# Patient Record
Sex: Male | Born: 1980 | Race: White | Hispanic: Yes | Marital: Single | Smoking: Never smoker
Health system: Southern US, Community
[De-identification: ages and names within clinical notes are randomized; demographics above are authoritative.]

## PROBLEM LIST (undated history)

## (undated) ENCOUNTER — Emergency Department (HOSPITAL_COMMUNITY): Discharge status: Absent without leave | Payer: Self-pay

## (undated) HISTORY — PX: LEG SURGERY: SHX1003

---

## 2002-11-24 ENCOUNTER — Emergency Department (HOSPITAL_COMMUNITY): Admission: EM | Admit: 2002-11-24 | Discharge: 2002-11-24 | Payer: Self-pay | Admitting: Emergency Medicine

## 2002-11-24 ENCOUNTER — Encounter: Payer: Self-pay | Admitting: Emergency Medicine

## 2002-12-28 ENCOUNTER — Encounter: Payer: Self-pay | Admitting: Emergency Medicine

## 2002-12-28 ENCOUNTER — Emergency Department (HOSPITAL_COMMUNITY): Admission: EM | Admit: 2002-12-28 | Discharge: 2002-12-28 | Payer: Self-pay | Admitting: Emergency Medicine

## 2003-02-21 ENCOUNTER — Emergency Department (HOSPITAL_COMMUNITY): Admission: EM | Admit: 2003-02-21 | Discharge: 2003-02-21 | Payer: Self-pay | Admitting: Emergency Medicine

## 2003-02-22 ENCOUNTER — Emergency Department (HOSPITAL_COMMUNITY): Admission: EM | Admit: 2003-02-22 | Discharge: 2003-02-22 | Payer: Self-pay | Admitting: Internal Medicine

## 2003-02-22 ENCOUNTER — Encounter: Payer: Self-pay | Admitting: Internal Medicine

## 2004-08-09 ENCOUNTER — Emergency Department (HOSPITAL_COMMUNITY): Admission: EM | Admit: 2004-08-09 | Discharge: 2004-08-09 | Payer: Self-pay | Admitting: Emergency Medicine

## 2004-08-22 ENCOUNTER — Emergency Department (HOSPITAL_COMMUNITY): Admission: EM | Admit: 2004-08-22 | Discharge: 2004-08-22 | Payer: Self-pay | Admitting: Emergency Medicine
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
## 2005-07-11 ENCOUNTER — Emergency Department (HOSPITAL_COMMUNITY): Admission: EM | Admit: 2005-07-11 | Discharge: 2005-07-11 | Payer: Self-pay | Admitting: Emergency Medicine

## 2006-10-22 ENCOUNTER — Emergency Department (HOSPITAL_COMMUNITY): Admission: EM | Admit: 2006-10-22 | Discharge: 2006-10-22 | Payer: Self-pay | Admitting: Emergency Medicine

## 2006-10-31 ENCOUNTER — Emergency Department (HOSPITAL_COMMUNITY): Admission: EM | Admit: 2006-10-31 | Discharge: 2006-10-31 | Payer: Self-pay | Admitting: Emergency Medicine

## 2006-11-05 ENCOUNTER — Emergency Department (HOSPITAL_COMMUNITY): Admission: EM | Admit: 2006-11-05 | Discharge: 2006-11-05 | Payer: Self-pay | Admitting: Emergency Medicine

## 2007-07-04 ENCOUNTER — Inpatient Hospital Stay (HOSPITAL_COMMUNITY): Admission: EM | Admit: 2007-07-04 | Discharge: 2007-07-05 | Payer: Self-pay | Admitting: Emergency Medicine

## 2007-07-13 ENCOUNTER — Encounter: Payer: Self-pay | Admitting: Orthopedic Surgery

## 2007-07-13 ENCOUNTER — Inpatient Hospital Stay (HOSPITAL_COMMUNITY): Admission: EM | Admit: 2007-07-13 | Discharge: 2007-07-18 | Payer: Self-pay | Admitting: Emergency Medicine

## 2007-07-14 ENCOUNTER — Ambulatory Visit: Payer: Self-pay | Admitting: Orthopedic Surgery

## 2007-07-18 ENCOUNTER — Encounter: Payer: Self-pay | Admitting: Orthopedic Surgery

## 2007-07-20 ENCOUNTER — Encounter: Payer: Self-pay | Admitting: Orthopedic Surgery

## 2007-07-24 ENCOUNTER — Ambulatory Visit: Payer: Self-pay | Admitting: Orthopedic Surgery

## 2007-07-24 ENCOUNTER — Telehealth: Payer: Self-pay | Admitting: Orthopedic Surgery

## 2007-07-24 DIAGNOSIS — S82209A Unspecified fracture of shaft of unspecified tibia, initial encounter for closed fracture: Secondary | ICD-10-CM | POA: Insufficient documentation

## 2007-07-24 DIAGNOSIS — S82409A Unspecified fracture of shaft of unspecified fibula, initial encounter for closed fracture: Secondary | ICD-10-CM

## 2007-07-31 ENCOUNTER — Telehealth: Payer: Self-pay | Admitting: Orthopedic Surgery

## 2007-08-02 ENCOUNTER — Encounter: Payer: Self-pay | Admitting: Orthopedic Surgery

## 2007-08-03 ENCOUNTER — Encounter: Payer: Self-pay | Admitting: Orthopedic Surgery

## 2007-08-03 ENCOUNTER — Emergency Department (HOSPITAL_COMMUNITY): Admission: EM | Admit: 2007-08-03 | Discharge: 2007-08-03 | Payer: Self-pay | Admitting: Emergency Medicine

## 2007-08-03 ENCOUNTER — Telehealth: Payer: Self-pay | Admitting: Orthopedic Surgery

## 2007-08-07 ENCOUNTER — Telehealth (INDEPENDENT_AMBULATORY_CARE_PROVIDER_SITE_OTHER): Payer: Self-pay | Admitting: *Deleted

## 2007-08-08 ENCOUNTER — Encounter (HOSPITAL_COMMUNITY): Admission: RE | Admit: 2007-08-08 | Discharge: 2007-09-07 | Payer: Self-pay | Admitting: Orthopaedic Surgery

## 2007-08-08 ENCOUNTER — Ambulatory Visit (HOSPITAL_COMMUNITY): Payer: Self-pay | Admitting: Orthopaedic Surgery

## 2007-08-13 ENCOUNTER — Encounter: Payer: Self-pay | Admitting: Orthopedic Surgery

## 2007-08-14 ENCOUNTER — Encounter: Payer: Self-pay | Admitting: Orthopedic Surgery

## 2007-08-14 ENCOUNTER — Ambulatory Visit (HOSPITAL_COMMUNITY): Admission: RE | Admit: 2007-08-14 | Discharge: 2007-08-14 | Payer: Self-pay | Admitting: Orthopaedic Surgery

## 2007-08-15 ENCOUNTER — Telehealth: Payer: Self-pay | Admitting: Orthopedic Surgery

## 2007-08-16 ENCOUNTER — Emergency Department (HOSPITAL_COMMUNITY): Admission: EM | Admit: 2007-08-16 | Discharge: 2007-08-16 | Payer: Self-pay | Admitting: Emergency Medicine

## 2007-08-16 ENCOUNTER — Encounter: Payer: Self-pay | Admitting: Orthopedic Surgery

## 2007-08-16 ENCOUNTER — Ambulatory Visit: Payer: Self-pay | Admitting: Orthopedic Surgery

## 2007-08-16 ENCOUNTER — Inpatient Hospital Stay (HOSPITAL_COMMUNITY): Admission: AD | Admit: 2007-08-16 | Discharge: 2007-08-22 | Payer: Self-pay | Admitting: Orthopedic Surgery

## 2007-08-17 ENCOUNTER — Telehealth: Payer: Self-pay | Admitting: Orthopedic Surgery

## 2007-08-17 ENCOUNTER — Encounter: Payer: Self-pay | Admitting: Orthopedic Surgery

## 2007-08-18 ENCOUNTER — Encounter: Payer: Self-pay | Admitting: Orthopedic Surgery

## 2007-08-19 ENCOUNTER — Encounter: Payer: Self-pay | Admitting: Orthopedic Surgery

## 2007-08-21 ENCOUNTER — Encounter: Payer: Self-pay | Admitting: Orthopedic Surgery

## 2007-08-21 ENCOUNTER — Telehealth: Payer: Self-pay | Admitting: Orthopedic Surgery

## 2007-08-22 ENCOUNTER — Telehealth: Payer: Self-pay | Admitting: Orthopedic Surgery

## 2007-08-23 ENCOUNTER — Encounter: Payer: Self-pay | Admitting: Orthopedic Surgery

## 2007-08-23 ENCOUNTER — Telehealth: Payer: Self-pay | Admitting: Orthopedic Surgery

## 2007-08-28 ENCOUNTER — Ambulatory Visit: Payer: Self-pay | Admitting: Orthopedic Surgery

## 2007-08-28 ENCOUNTER — Telehealth: Payer: Self-pay | Admitting: Orthopedic Surgery

## 2007-08-28 DIAGNOSIS — L02419 Cutaneous abscess of limb, unspecified: Secondary | ICD-10-CM | POA: Insufficient documentation

## 2007-08-28 DIAGNOSIS — L03119 Cellulitis of unspecified part of limb: Secondary | ICD-10-CM

## 2007-08-29 ENCOUNTER — Telehealth: Payer: Self-pay | Admitting: Orthopedic Surgery

## 2007-08-29 ENCOUNTER — Encounter: Payer: Self-pay | Admitting: Orthopedic Surgery

## 2007-08-31 ENCOUNTER — Emergency Department (HOSPITAL_COMMUNITY): Admission: EM | Admit: 2007-08-31 | Discharge: 2007-08-31 | Payer: Self-pay | Admitting: Emergency Medicine

## 2007-08-31 ENCOUNTER — Encounter: Payer: Self-pay | Admitting: Orthopedic Surgery

## 2007-09-01 ENCOUNTER — Emergency Department (HOSPITAL_COMMUNITY): Admission: EM | Admit: 2007-09-01 | Discharge: 2007-09-01 | Payer: Self-pay | Admitting: Emergency Medicine

## 2007-09-01 ENCOUNTER — Telehealth: Payer: Self-pay | Admitting: Orthopedic Surgery

## 2007-09-02 ENCOUNTER — Emergency Department (HOSPITAL_COMMUNITY): Admission: EM | Admit: 2007-09-02 | Discharge: 2007-09-02 | Payer: Self-pay | Admitting: Emergency Medicine

## 2007-09-03 ENCOUNTER — Encounter: Payer: Self-pay | Admitting: Orthopedic Surgery

## 2007-09-03 ENCOUNTER — Emergency Department (HOSPITAL_COMMUNITY): Admission: EM | Admit: 2007-09-03 | Discharge: 2007-09-03 | Payer: Self-pay | Admitting: Emergency Medicine

## 2007-09-04 ENCOUNTER — Telehealth: Payer: Self-pay | Admitting: Orthopedic Surgery

## 2007-09-05 ENCOUNTER — Encounter: Payer: Self-pay | Admitting: Orthopedic Surgery

## 2007-09-06 ENCOUNTER — Ambulatory Visit: Payer: Self-pay | Admitting: Orthopedic Surgery

## 2007-09-06 ENCOUNTER — Telehealth: Payer: Self-pay | Admitting: Orthopedic Surgery

## 2007-09-14 ENCOUNTER — Telehealth: Payer: Self-pay | Admitting: Orthopedic Surgery

## 2007-10-05 ENCOUNTER — Ambulatory Visit: Payer: Self-pay | Admitting: Orthopedic Surgery

## 2007-10-12 ENCOUNTER — Encounter: Payer: Self-pay | Admitting: Orthopedic Surgery

## 2007-10-13 ENCOUNTER — Encounter: Payer: Self-pay | Admitting: Orthopedic Surgery

## 2007-10-18 ENCOUNTER — Encounter: Payer: Self-pay | Admitting: Orthopedic Surgery

## 2007-10-31 ENCOUNTER — Emergency Department (HOSPITAL_COMMUNITY): Admission: EM | Admit: 2007-10-31 | Discharge: 2007-10-31 | Payer: Self-pay | Admitting: Emergency Medicine

## 2007-11-02 ENCOUNTER — Encounter: Payer: Self-pay | Admitting: Orthopedic Surgery

## 2007-11-08 ENCOUNTER — Ambulatory Visit: Payer: Self-pay | Admitting: Orthopedic Surgery

## 2008-01-02 ENCOUNTER — Telehealth: Payer: Self-pay | Admitting: Orthopedic Surgery

## 2008-01-31 ENCOUNTER — Ambulatory Visit: Payer: Self-pay | Admitting: Orthopedic Surgery

## 2008-02-01 ENCOUNTER — Encounter: Payer: Self-pay | Admitting: Orthopedic Surgery

## 2008-02-01 ENCOUNTER — Telehealth: Payer: Self-pay | Admitting: Orthopedic Surgery

## 2008-02-02 ENCOUNTER — Encounter: Payer: Self-pay | Admitting: Orthopedic Surgery

## 2008-02-06 ENCOUNTER — Telehealth: Payer: Self-pay | Admitting: Orthopedic Surgery

## 2008-02-07 ENCOUNTER — Telehealth: Payer: Self-pay | Admitting: Orthopedic Surgery

## 2008-02-09 ENCOUNTER — Encounter: Payer: Self-pay | Admitting: Orthopedic Surgery

## 2008-02-13 ENCOUNTER — Encounter: Payer: Self-pay | Admitting: Orthopedic Surgery

## 2008-02-13 ENCOUNTER — Telehealth: Payer: Self-pay | Admitting: Orthopedic Surgery

## 2008-02-14 ENCOUNTER — Encounter: Payer: Self-pay | Admitting: Orthopedic Surgery

## 2008-02-16 ENCOUNTER — Ambulatory Visit: Payer: Self-pay | Admitting: Orthopedic Surgery

## 2008-02-16 ENCOUNTER — Ambulatory Visit (HOSPITAL_COMMUNITY): Admission: RE | Admit: 2008-02-16 | Discharge: 2008-02-16 | Payer: Self-pay | Admitting: Orthopedic Surgery

## 2008-02-20 ENCOUNTER — Ambulatory Visit: Payer: Self-pay | Admitting: Orthopedic Surgery

## 2008-02-20 DIAGNOSIS — M7660 Achilles tendinitis, unspecified leg: Secondary | ICD-10-CM | POA: Insufficient documentation

## 2008-02-28 ENCOUNTER — Ambulatory Visit: Payer: Self-pay | Admitting: Orthopedic Surgery

## 2008-03-20 ENCOUNTER — Telehealth: Payer: Self-pay | Admitting: Orthopedic Surgery

## 2008-04-10 ENCOUNTER — Ambulatory Visit: Payer: Self-pay | Admitting: Orthopedic Surgery

## 2008-04-11 ENCOUNTER — Telehealth: Payer: Self-pay | Admitting: Orthopedic Surgery

## 2008-04-19 ENCOUNTER — Encounter: Payer: Self-pay | Admitting: Orthopedic Surgery

## 2008-05-22 ENCOUNTER — Ambulatory Visit: Payer: Self-pay | Admitting: Orthopedic Surgery

## 2008-05-24 ENCOUNTER — Encounter: Payer: Self-pay | Admitting: Orthopedic Surgery

## 2008-06-03 ENCOUNTER — Telehealth: Payer: Self-pay | Admitting: Orthopedic Surgery

## 2008-07-11 ENCOUNTER — Telehealth: Payer: Self-pay | Admitting: Orthopedic Surgery

## 2008-07-11 ENCOUNTER — Ambulatory Visit: Payer: Self-pay | Admitting: Orthopedic Surgery

## 2008-09-02 ENCOUNTER — Emergency Department: Payer: Self-pay | Admitting: Emergency Medicine

## 2008-10-09 ENCOUNTER — Telehealth: Payer: Self-pay | Admitting: Orthopedic Surgery

## 2008-10-09 ENCOUNTER — Encounter (INDEPENDENT_AMBULATORY_CARE_PROVIDER_SITE_OTHER): Payer: Self-pay | Admitting: *Deleted

## 2009-03-17 ENCOUNTER — Encounter: Payer: Self-pay | Admitting: Orthopedic Surgery

## 2009-05-25 ENCOUNTER — Emergency Department (HOSPITAL_COMMUNITY): Admission: EM | Admit: 2009-05-25 | Discharge: 2009-05-25 | Payer: Self-pay | Admitting: Emergency Medicine

## 2009-06-26 IMAGING — CR DG TIBIA/FIBULA 2V*L*
2 series · 2 of 2 positions shown · non-contrast
Comparison: 07/14/2007

CLINICAL DATA: Fall.  Recent surgery.

LEFT TIBIA AND FIBULA - 2 VIEW

[view not recorded (1 of 2)]
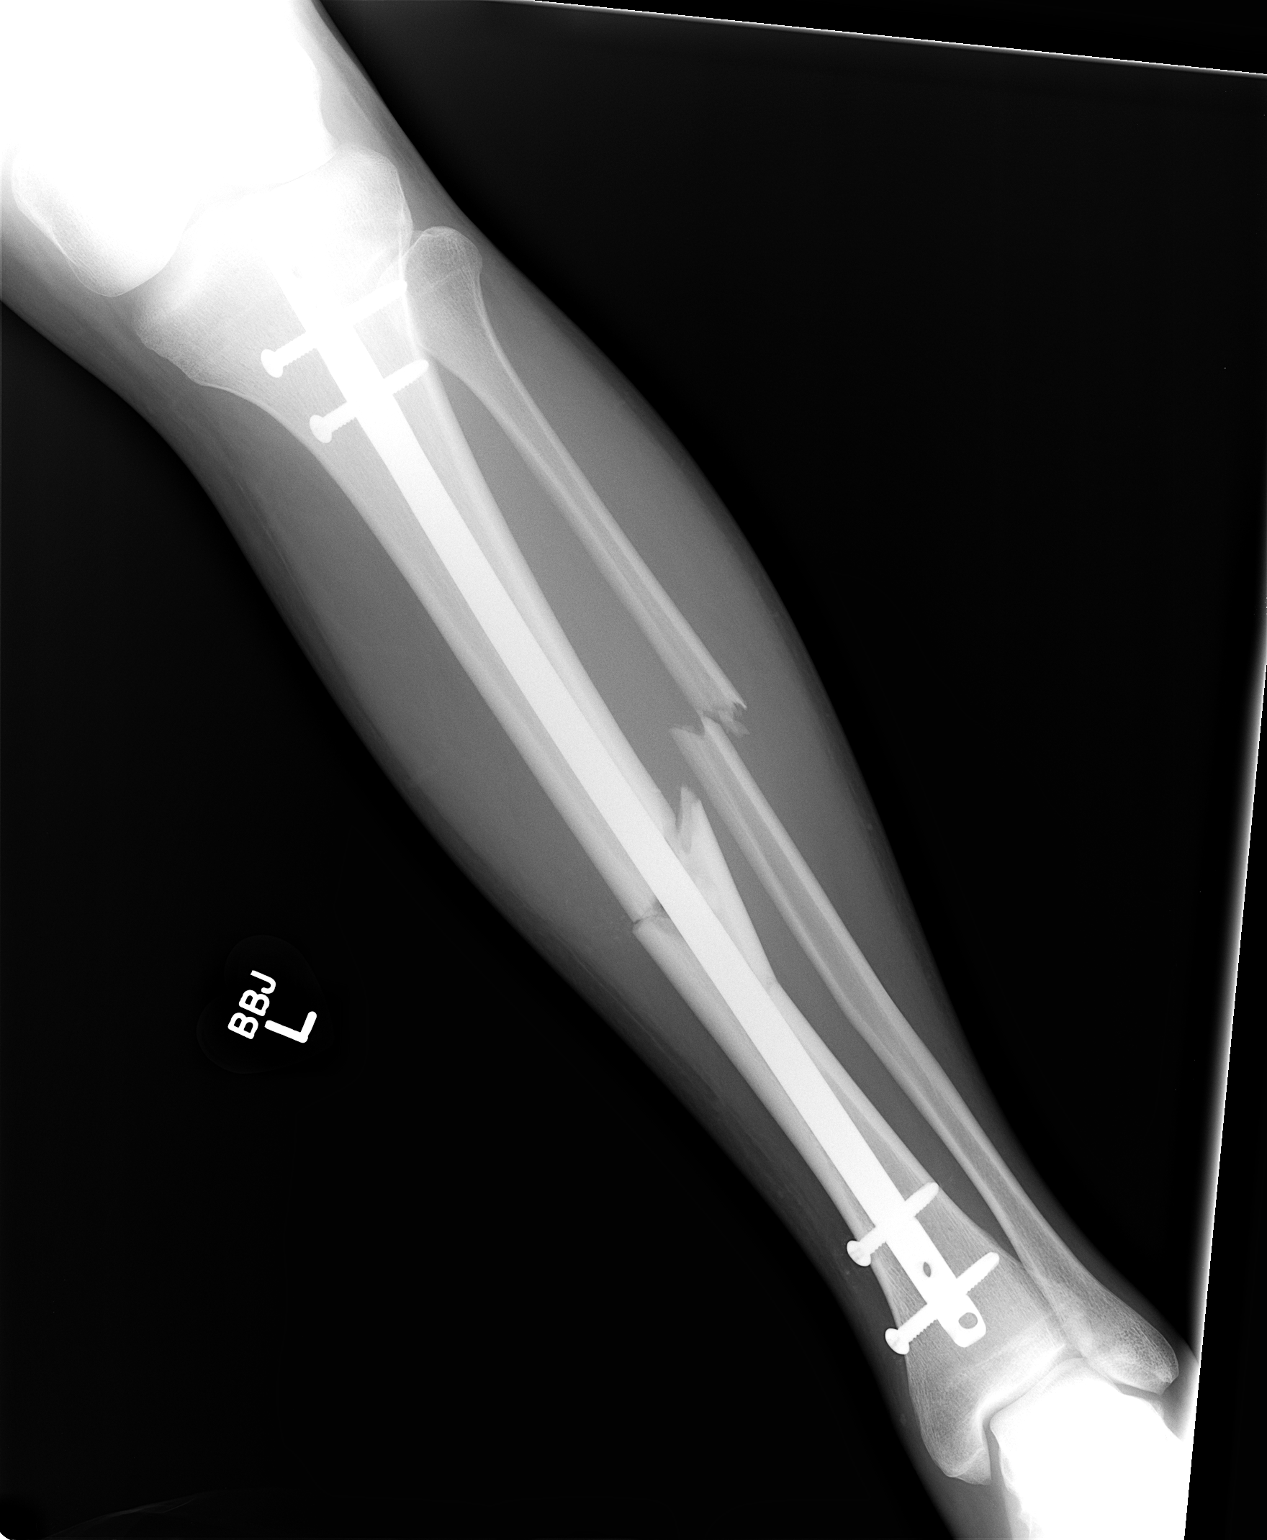

[view not recorded (2 of 2)]
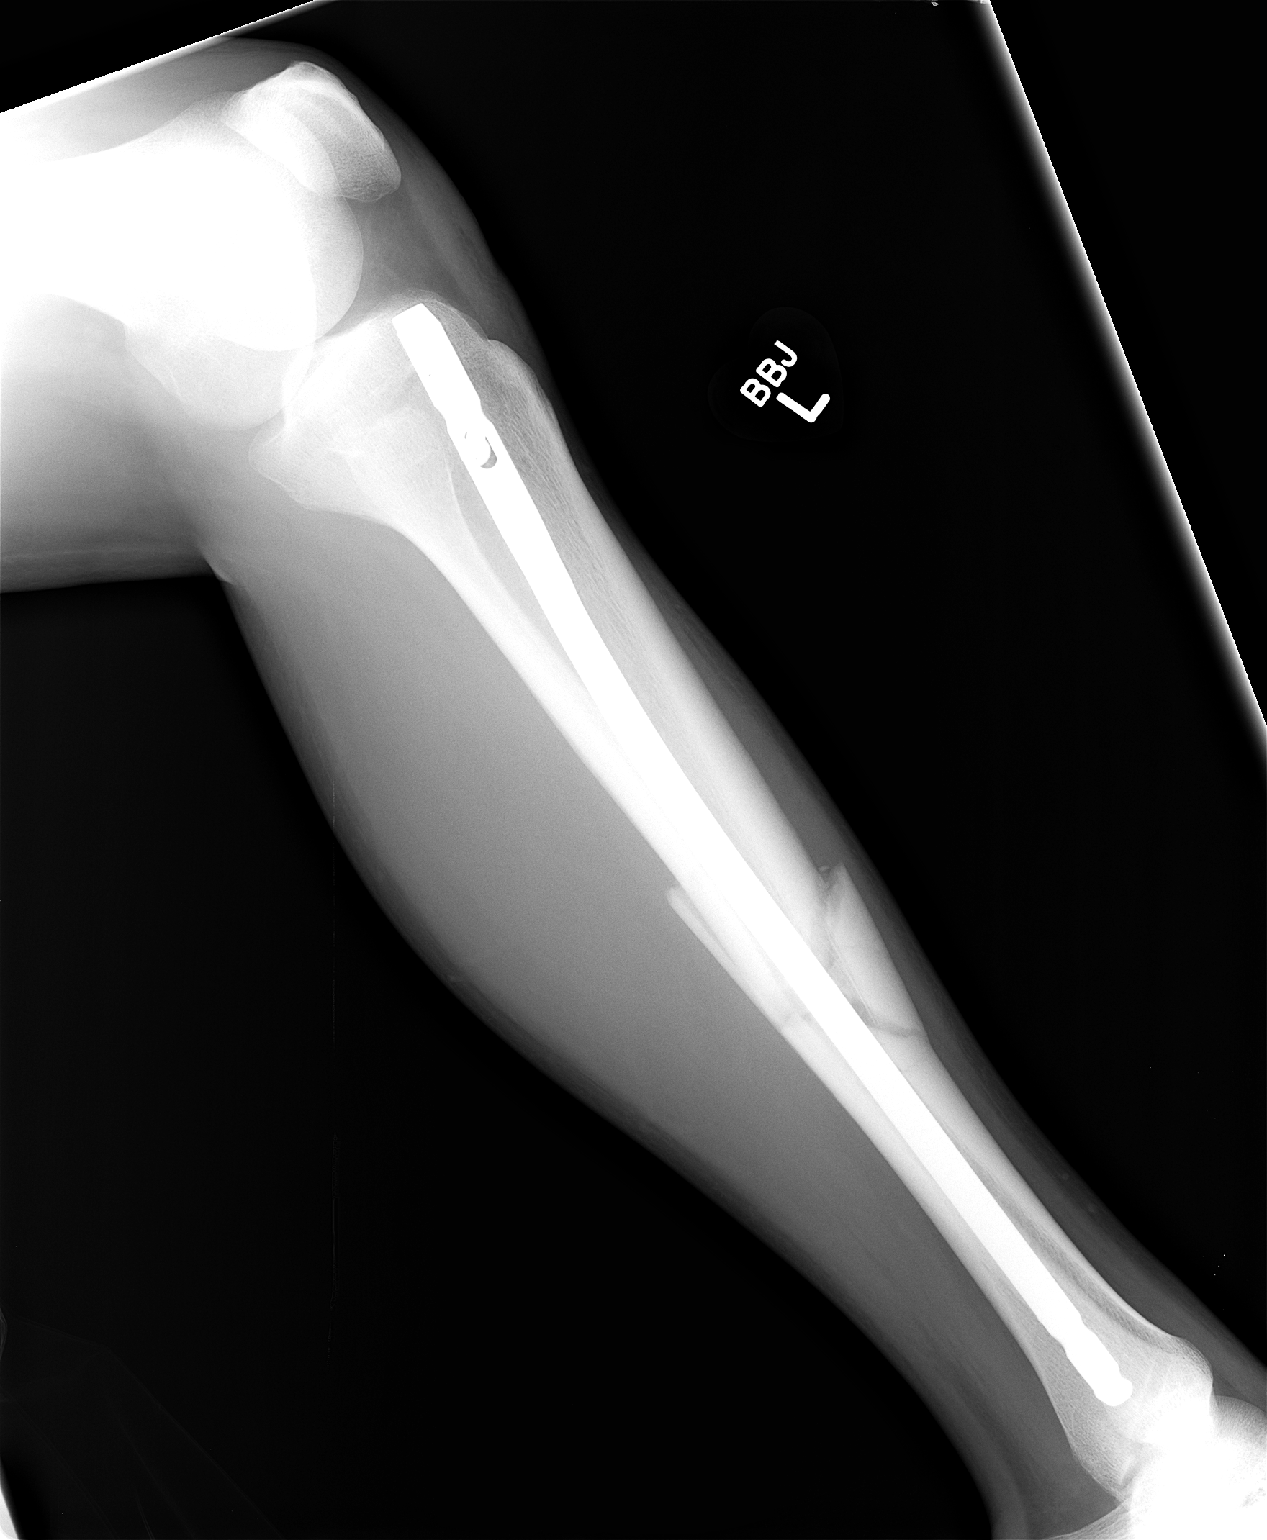

[2 of 2 positions shown; findings below may reference images not displayed]

FINDINGS: Antegrade intramedullary tibial rod with two proximal and
to distal interlocking screws noted.  Lateral butterfly fragment
appears in similar position to the postoperative fluoroscopic spot
films.  The displaced fibular fracture likewise appears stable
compared to the postoperative images.  No new fracture or
complicating feature is identified.
IMPRESSION: 1.  Stable appearance compared to the postoperative images.  No
complicating features identified.  No interval new fracture noted.

## 2009-07-07 IMAGING — XA IR FLUORO GUIDE CV LINE*R*
1 series · 2 of 2 positions shown · non-contrast
Comparison: none

CLINICAL DATA: Extremity infection, antibiotic use.

UPPER EXTREMITY PICC PLACEMENT WITH ULTRASOUND AND FLUORO GUIDANCE
TECHNIQUE: The right arm was prepped with chlorhexidine, draped in
the usual sterile fashion, and infiltrated locally with 1%
Lidocaine.  Ultrasound demonstrated patency of the right basilic
vein.  Under real-time ultrasound guidance, this vein was accessed
with a 21 gauge micropuncture needle.  Ultrasound image
documentation was performed.  The needle was exchanged over a
guidewire for a peel-away sheath through which a a 5-French double
lumen PICC trimmed to 42cm was advanced, positioned with its tip at
the distal SVC/right atrial junction.  Fluoroscopy during the
procedure and fluoro spot radiograph confirms appropriate catheter
position.  The catheter was flushed, secured to the skin with
Prolene sutures, and covered with a sterile dressing.  No immediate
complication.
Fluoroscopy Time: 0.5 minutes

[Series 1: run · 2 of 2 slices shown]
[im 1/2]
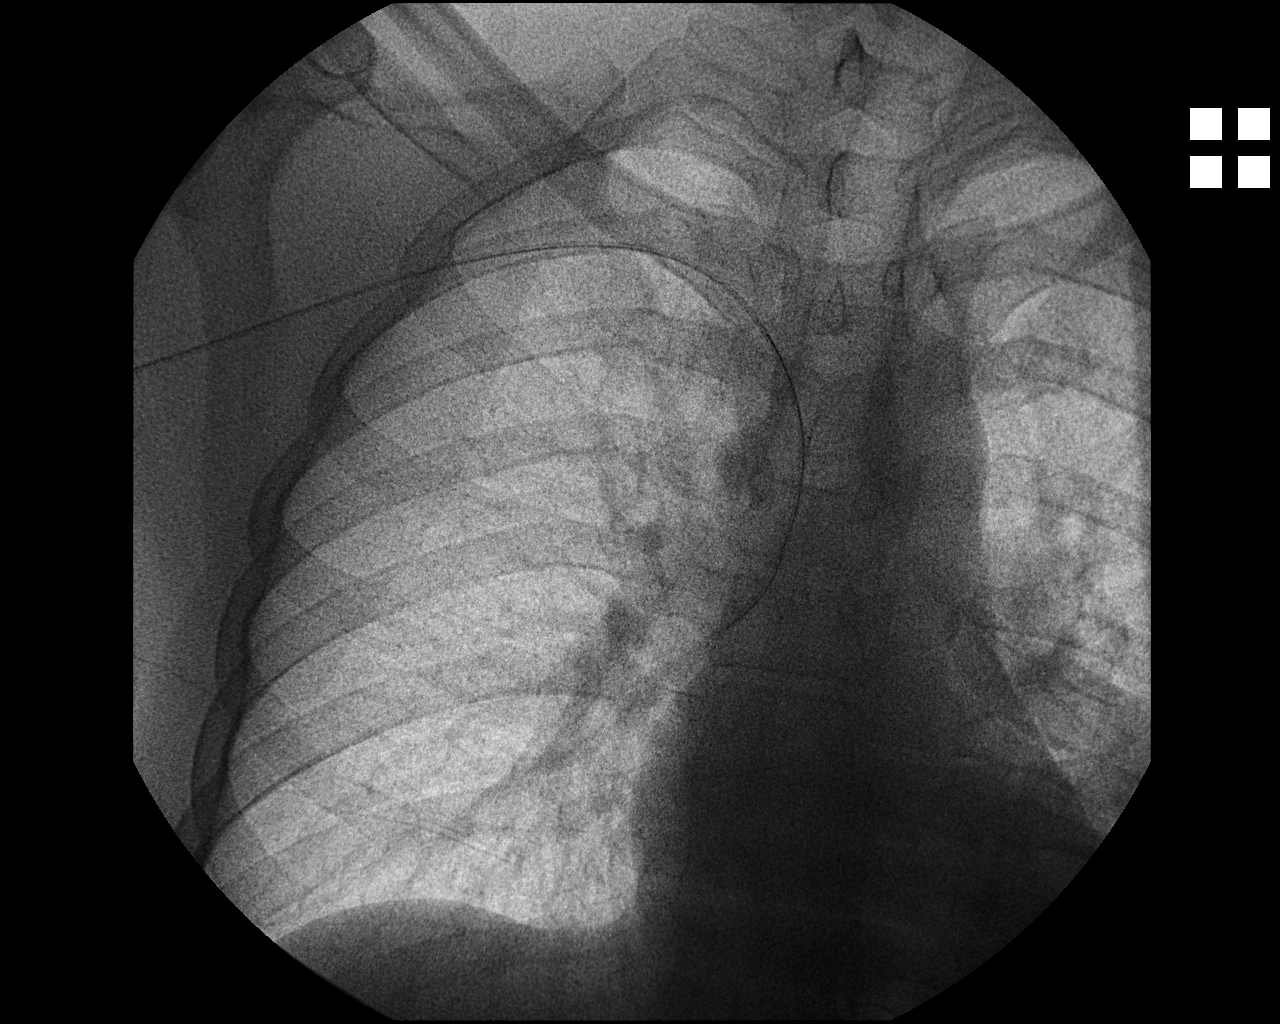
[im 2/2]
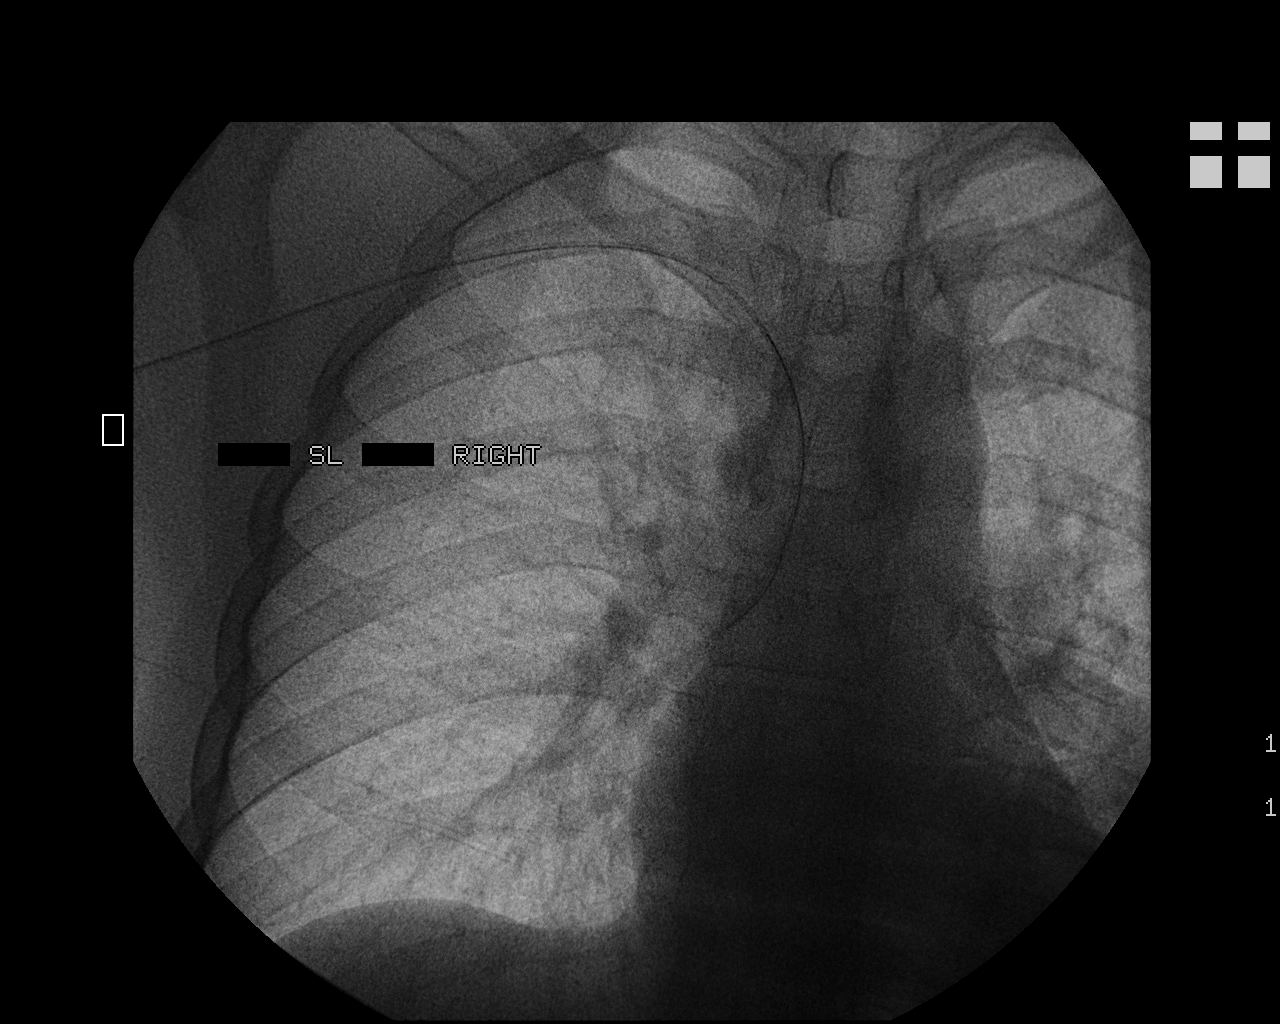

[2 of 2 positions shown; findings below may reference images not displayed]

IMPRESSION: Technically successful right arm PICC placement with ultrasound and
fluoroscopic guidance.  The catheter is ready for use.

## 2009-07-28 ENCOUNTER — Telehealth: Payer: Self-pay | Admitting: Orthopedic Surgery

## 2009-08-07 ENCOUNTER — Telehealth: Payer: Self-pay | Admitting: Orthopedic Surgery

## 2009-08-12 ENCOUNTER — Telehealth: Payer: Self-pay | Admitting: Orthopedic Surgery

## 2009-08-28 ENCOUNTER — Telehealth: Payer: Self-pay | Admitting: Orthopedic Surgery

## 2009-09-04 ENCOUNTER — Ambulatory Visit (HOSPITAL_COMMUNITY)
Admission: RE | Admit: 2009-09-04 | Discharge: 2009-09-04 | Payer: Self-pay | Admitting: Physical Medicine and Rehabilitation

## 2009-11-25 ENCOUNTER — Emergency Department (HOSPITAL_COMMUNITY): Admission: EM | Admit: 2009-11-25 | Discharge: 2009-11-25 | Payer: Self-pay | Admitting: Emergency Medicine

## 2010-01-20 ENCOUNTER — Emergency Department (HOSPITAL_COMMUNITY): Admission: EM | Admit: 2010-01-20 | Discharge: 2010-01-20 | Payer: Self-pay | Admitting: Obstetrics and Gynecology

## 2010-02-05 ENCOUNTER — Emergency Department (HOSPITAL_COMMUNITY): Admission: EM | Admit: 2010-02-05 | Discharge: 2010-02-05 | Payer: Self-pay | Admitting: Emergency Medicine

## 2010-06-02 NOTE — Progress Notes (Signed)
Summary: call from patient   ---- Converted from flag ---- ---- 08/07/2009 12:25 PM, Jacklynn Ganong wrote: Konrad Hoak needs you to call him at [phone].  Has a question about  if Dr. Romeo Apple is going to continue to see him and prescribe his meds.  Asking if he pays cash if he will see him. ------------------------------ I called patient back; left voice mail msg.  I have also left msg at [employer], [employer], to inquire about status of claim.  08/08/09 - Pamela from [employer] returned call and verified that patient's claim has settled, as of 08/27/08; claim is closed; therefore, patient's responsibility for any services provided.

## 2010-06-02 NOTE — Progress Notes (Signed)
Summary: Call back from patient  08/12/09 - Advised patient.  He will have our office do the referral to pain management.  ---- Converted from flag ---- ---- 08/11/2009 8:50 AM, Cammie Sickle wrote:   ---- 08/11/2009 8:42 AM, Fuller Canada MD wrote: Inform him that case is closed and he will need to see pain management specialist for pain meds   no appts needed   fracture has healed ------------------------------

## 2010-06-02 NOTE — Progress Notes (Signed)
Summary: vicodin refill  Phone Note Call from Patient Call back at [facility] Phone [phone]   Summary of Call: ok to refill vicodin 5 Initial call taken by: Chasity Tereasa Coop,  July 28, 2009 3:29 PM  Follow-up for Phone Call        refill Follow-up by: Fuller Canada MD,  July 28, 2009 3:30 PM  Additional Follow-up for Phone Call Additional follow up Details #1::        ok    Prescriptions: VICODIN 5-500 MG TABS (HYDROCODONE-ACETAMINOPHEN) one by mouth q 4 hrs as needed pain  #90 x 2   Entered by:   Ether Griffins   Authorized by:   Fuller Canada MD   Signed by:   Ether Griffins on 07/28/2009   Method used:   Historical   RxID:   [id number]

## 2010-06-02 NOTE — Progress Notes (Signed)
Summary: Patient declined appointment with Dr. Eduard Clos.  Phone Note From Other Clinic   Caller: Referral Coordinator Summary of Call: They called to let us know that they talked to the patient and declined the appointment because of his work schedule Initial call taken by: Waldon Reining,  August 28, 2009 3:19 PM

## 2010-06-06 ENCOUNTER — Emergency Department (HOSPITAL_COMMUNITY)
Admission: EM | Admit: 2010-06-06 | Discharge: 2010-06-06 | Disposition: A | Discharge status: Home / Self Care | Payer: Self-pay | Attending: Emergency Medicine | Admitting: Emergency Medicine

## 2010-06-06 ENCOUNTER — Emergency Department (HOSPITAL_COMMUNITY): Payer: Self-pay

## 2010-06-06 DIAGNOSIS — IMO0002 Reserved for concepts with insufficient information to code with codable children: Secondary | ICD-10-CM | POA: Insufficient documentation

## 2010-06-06 DIAGNOSIS — M79609 Pain in unspecified limb: Secondary | ICD-10-CM | POA: Insufficient documentation

## 2010-06-06 DIAGNOSIS — F411 Generalized anxiety disorder: Secondary | ICD-10-CM | POA: Insufficient documentation

## 2010-06-06 DIAGNOSIS — Z9889 Other specified postprocedural states: Secondary | ICD-10-CM | POA: Insufficient documentation

## 2010-06-06 DIAGNOSIS — G8929 Other chronic pain: Secondary | ICD-10-CM | POA: Insufficient documentation

## 2010-06-24 ENCOUNTER — Encounter: Payer: Self-pay | Admitting: Orthopedic Surgery

## 2010-06-30 NOTE — Letter (Signed)
Summary: Phone Note  Phone Note   Imported By: Cammie Sickle 06/26/2010 11:25:57  _____________________________________________________________________  External Attachment:    Type:   Image     Comment:   External Document

## 2010-07-16 LAB — CULTURE, ROUTINE-ABSCESS

## 2010-09-15 NOTE — H&P (Signed)
NAME:  DAMICO, PARTIN              ACCOUNT NO.:  [id number]   MEDICAL RECORD NO.:  [id number]          PATIENT TYPE:  INP   LOCATION:  ED99                          FACILITY:  [facility]   PHYSICIAN:  Osvaldo Shipper, MD     DATE OF BIRTH:  [birth year]   DATE OF ADMISSION:  07/04/2007  DATE OF DISCHARGE:  LH                              HISTORY & PHYSICAL   PRIMARY MEDICAL DOCTOR:  Unassigned   ADMISSION DIAGNOSES:  1. Unintentional drug overdose.  2. History of depression.   CHIEF COMPLAINTS:  Decreased responsiveness along with nausea, vomiting,  diarrhea.   HISTORY OF PRESENT ILLNESS:  The patient is a 30 year old Hispanic male  who has a remote history of depression and has one episode of suicide  attempt in the past, but he has never been hospitalized in a psychiatric  facility as per the patient who was drinking with his friends last night  and he took 20 pills of Lortab 7.5/5, four pills of Percocet 10, one  pill of OxyContin and 3 pills of Xanax, unknown dose.  The patient  started becoming lethargic and his friend brought him to the ED.  He  mentioned that this drug use was purely recreational.  He denied any  suicidal attempt or intent.   Currently the patient is complaining of nausea.  He is had one episode  of emesis and he has had a few episodes of diarrhea while he was in the  ED.   MEDICATIONS AT HOME:  None.   ALLERGIES:  NO KNOWN DRUG ALLERGIES.   PAST MEDICAL HISTORY:  Except for the suicidal attempt, unremarkable as  per the patient.  He has never had any surgeries in the past.   SOCIAL HISTORY:  Lives in [place] with his mother.  He is separated.  He has two children.  He currently is unemployed.  Occasionally, smokes  and denies any illicit drug use.  Does not drink on a daily basis.   FAMILY HISTORY:  Positive for heart disease, emphysema and lung cancer.   REVIEW OF SYSTEMS:  A 10-point system was negative except as mentioned  in the HPI.   PHYSICAL  EXAMINATION:  VITAL SIGNS: Temperature is 98.6, blood pressure  131/61, heart rate 72, respiratory rate 18, saturation 100% on room air.  GENERAL:  A well-developed, well-nourished young male in no distress.  HEENT:  There is no pallor, no icterus.  Oral mucous membranes moist.  No oral lesions are noted.  NECK:  Soft and supple.  No thyromegaly is appreciated.  LUNGS:  Clear to auscultation bilaterally.  No wheezing, rales or  rhonchi appreciated.  CARDIOVASCULAR:  S1, S2 is normal, regular.  No murmurs appreciated.  ABDOMEN:  Soft, there is a vague mild tenderness in the abdomen.  No  rebound rigidity or guarding.  Bowel sounds are present.  No mass or  organomegaly is appreciated.  EXTREMITIES:  Show no edema.  Peripheral pulses are palpable.  SKIN:  Multiple tattoo formation.   LABORATORY DATA:  His CBC shows a white count 10.6, hemoglobin 15,  platelet counts 288. His  C-met is unremarkable.  Amylase/lipase is  normal.  Tylenol level is less than 10 x2, salicylate level less than  four.  Urine drug screen positive for benzodiazepines and opiates.  Alcohol level 273.   No imaging studies have been done so far.   ASSESSMENT:  This is a 30 year old male who presents with drug overdose.  He was lethargic when he came in and now he is awake and alert and  oriented.  This drug overdose was recreational.  There was no intent to  take his own life.  The patient is quite emphatic about this.  He is  having nausea, vomiting and diarrhea which could be a sequelae of all  the drugs that he took.   PLAN:  1. Likely unintentional, recreational drug use.  We will observe him      in the hospital and see how he does, then we will have ACT team      evaluate him tomorrow morning when he will be more medically      stable.  I anticipate the patient going home if he improves.  2. Nausea, vomiting, diarrhea likely has a result of all of his      medications that he took.  We will treat this  symptomatically.      Will obtain acute abdominal series.  We will put on a PPI.  If his      symptoms do not improve, other imaging studies such as a CAT scan      may have to be considered.  3. DVT prophylaxis will be initiated.  He will be put on p.r.n. Ativan      for agitation, anxiety.  His alcohol use, though he says he takes      only occasionally, cannot be verified.  4. Anticipate patient going home in the next day or two.      Osvaldo Shipper, MD  Electronically Signed     GK/MEDQ  D:  07/04/2007  T:  07/04/2007  Job:  [id number]
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
## 2010-09-15 NOTE — Group Therapy Note (Signed)
NAME:  [name], [name] NO.:  [id number]   MEDICAL RECORD NO.:  [id number]          PATIENT TYPE:  INP   LOCATION:  A329                          FACILITY:  [facility]   PHYSICIAN:  Vickki Hearing, M.D.DATE OF BIRTH:  12/29/1980   DATE OF PROCEDURE:  08/18/2007  DATE OF DISCHARGE:                                 PROGRESS NOTE   Jeffrey Huber was interested in transferring his care to [facility];  however, his nurse from his [employer] could not get  that arranged.  Therefore, the patient's surgery was postponed.  In  addition to that, he ate throughout Thursday night and was not  surgically sound to have surgery due to his frequent intake of food  throughout the early morning of Friday morning and not being NPO after  midnight.   There was much confusion regarding his disposition.  He and his nurse  had discussed him being transferred to [facility].  She was trying to  arrange a physician there to take care of him.  This was not  accomplished.  Some of this was done outside of my knowledge, and  therefore, the disposition was unclear.  However, Jeffrey Huber did agree to  have surgery here on Saturday morning.   He remains afebrile and his pain level is controlled with a PCA pump and  oral analgesics.      Vickki Hearing, M.D.  Electronically Signed     SEH/MEDQ  D:  08/19/2007  T:  08/19/2007  Job:  [id number]

## 2010-09-15 NOTE — Discharge Summary (Signed)
NAME:  Jeffrey Huber, Jeffrey Huber NO.:  [id number]   MEDICAL RECORD NO.:  [id number]          PATIENT TYPE:  INP   LOCATION:  A306                          FACILITY:  [facility]   PHYSICIAN:  Dorris Singh, DO    DATE OF BIRTH:  [birth year]   DATE OF ADMISSION:  07/04/2007  DATE OF DISCHARGE:  03/04/2009LH                               DISCHARGE SUMMARY   PRIMARY CARE PHYSICIAN:  He is unassigned.   ADMISSION DIAGNOSIS:  Unintentional drug overdose and a history of  depression.   DISCHARGE DIAGNOSIS:  Unintentional drug overdose and a history of  depression.   HISTORY/PHYSICAL:  His H&P was done by Dr. Rito Ehrlich, please refer to  that.   HOSPITAL COURSE:  The patient has markedly improved due to his  unintentional recreational use of Percocets and hydrocodone he states  that he was taking and did not realize how many he had taken.  Luckily,  he was with friends who were able to recognize that his pulse rate and  his breathing had changed dramatically.  He was seen by ACT team today,  who Samson Frederic with the ACT team made him sign a contract for safety and he  will then be sent home.  I will go ahead and get him set up with primary  care physician on call.  His nausea and vomiting has decreased.  He is  able to eat without any problems.  We will go ahead and send him home  today.  His condition is stable.   DISPOSITION:  To home with follow up for PCP.  I discussed at length  with the patient his need to really monitor the type of medications that  he puts in his body at any time, even for recreation. The patient stated  understanding.      Dorris Singh, DO  Electronically Signed     CB/MEDQ  D:  07/05/2007  T:  07/05/2007  Job:  [id number]
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
## 2010-09-15 NOTE — Discharge Summary (Signed)
NAME:  Jeffrey Huber, Jeffrey Huber NO.:  [id number]   MEDICAL RECORD NO.:  [id number]          PATIENT TYPE:  INP   LOCATION:  A329                          FACILITY:  [facility]   PHYSICIAN:  Vickki Hearing, M.D.DATE OF BIRTH:  12/12/1980   DATE OF ADMISSION:  08/16/2007  DATE OF DISCHARGE:  LH                               DISCHARGE SUMMARY   ADMITTING DIAGNOSES:  1. Infection left tibia.  2. Left tibia-fibula fracture.   DISCHARGE DIAGNOSES:  1. Infection left tibia.  2. Left tibia-fibula fracture.   HISTORY:  This is a 30 year old male who was injured while logging.  A  tree snapped and hit his left tibia.  He sustained a closed left tib-fib  fracture with moderate sized butterfly fragment on July 13, 2007.  He  had surgery on July 14, 2007, with closed intramedullary nailing with a  Synthes locked nail.  We used a 10 x 345 mm nail.  The patient initially  did well postoperatively, essentially had no major problems and was  discharged home.   Postoperatively, radiographs were taken.  He was doing fine.  However,  some time around August 03, 2007, he presented to the emergency room  complaining of pain after falling.  No deformity was noted.  Radiographs  were noted as normal.   He presented back to the emergency room again on August 16, 2007, at  [facility] asking for pain medication.   During the week of August 07, 2007 through August 11, 2007, the patient  presented to Dr. Hilda Lias, who was covering for me in my absence with a  soft tissue infection over the mid shaft of his tibia.  IV vancomycin  was started.  Cultures were obtained from a draining wound which was  pinpoint and was found to have methicillin-resistant staph, sensitive to  vancomycin.   At that time on August 10, 2007, his sed rate was 1, his white count was  70.  He had no left shift and his wound seemed to clearing on  vancomycin.   I saw the patient on August 16, 2007.  He had increased  pain and drainage  and I put him in the hospital.  I kept him on vancomycin.  His sed rate  was 5.  His C-reactive protein was 0.4.  His white count was still  normal.  He was afebrile.  At that time, he had a pinpoint area of  drainage from the posteromedial aspect of the soft tissue at the mid  shaft level of the tibia.   The patient was scheduled for surgery for Friday, August 18, 2007,  however, I was alerted that the patient wanted to be transferred to  [facility].  I spoke to Rico Sheehan, [phone], who is the  case manager for [name] as this is a Workers Compensation case, and  she indicated that Dr. Hyman Hopes had accepted transfer via his nurse Almira Coaster,  and the patient was to be transferred to whoever was on-call for Dr.  Hyman Hopes, and I attempted to make these arrangements.  The patient is  afebrile with  stable vital signs.  He is on a Dilaudid PCA pump and  Percocet 5 mg q.4 p.r.n. for pain.   DISCHARGE DISPOSITION:  Transfer to [facility].   OVERALL CONDITION:  Stable.      Vickki Hearing, M.D.  Electronically Signed     SEH/MEDQ  D:  08/17/2007  T:  08/17/2007  Job:  [id number]

## 2010-09-15 NOTE — Op Note (Signed)
NAME:  REESE, STOCKMAN NO.:  [id number]   MEDICAL RECORD NO.:  [id number]          PATIENT TYPE:  INP   LOCATION:  A329                          FACILITY:  [facility]   PHYSICIAN:  Vickki Hearing, M.D.DATE OF BIRTH:  07-Mar-1981   DATE OF PROCEDURE:  DATE OF DISCHARGE:                               OPERATIVE REPORT   HISTORY:  This is a 30 year old male, was injured on July 13, 2007,  logging.  He had a closed intramedullary nailing of his left tibia with  a Synthes locked nail on July 14, 2007, for closed fracture.  He did  well until the week of August 03, 2007, through August 07, 2007.  Apparently, he fell and went to the emergency room.  Radiographs were  taken, there was no change in position of the fracture and he eventually  saw Dr. Hilda Lias while I was on vacation, complaining of swelling and  pain in his left leg.  At that time, a diagnosis of cellulitis was made.  The patient was given some Rocephin, came back a day or so later and had  drainage.  This was cultured and grew out methicillin-resistant staph.  The patient was started on IV vancomycin and continued that until I saw  him on August 16, 2007, when he had increased pain and drainage and he  was admitted to the hospital with a sed rate of 5 and C-reactive protein  of 0.4, white count normal.  He was afebrile.  He had an area of  pinpoint drainage from the posteromedial aspect of the soft tissue at  the level of the mid shaft of the tibia.  He was scheduled for surgery  Friday, August 18, 2007.  However, there was confusion about his  disposition and therefore he was kept here and surgery was done today.   PREOPERATIVE DIAGNOSIS:  Abscess, left leg.   POSTOPERATIVE DIAGNOSIS:  Abscess, left leg.   PROCEDURE:  Incision and drainage of left lower leg abscess.   SURGEON:  Vickki Hearing, M.D.   ASSISTANT:  There were no assistants.   ANESTHETIC:  General.   SPECIMENS:  There were specimens for  culture, which were taken as swabs.   OPERATIVE FINDINGS:  There was a pinpoint area of drainage from  approximately the mid shaft level of the tissue just off the  posteromedial edge of the tibia.  This drainage was expressible from the  anteromedial surface of the leg, and upon opening this area, the  purulence tracked to the crest of the tibia above the level of the  periosteum.  There was no purulent material expressible from the  posterior soft tissues in the posteromedial compartments.  The bone was  not exposed.  The wound was irrigated.  The sinus tract was excised,  prior to excision of the sinus tract, it was probed and did not track  any deeper.   PROCEDURE IN DETAILS:  Mr. Novello leg was marked as the surgical site,  countersigned, was taken to surgery.  No antibiotics were given.  His  cultures were to be taken.  He was  put to sleep with general anesthetic  smoothly.   We prepped his leg with Betadine solution and scrub.  We draped him  sterilely, completed our time-out, elevate the tourniquet to 275 where  it stayed for 15 minutes.   We made the incision inferior to the most posteromedial aspect of the  tibia to ensure soft tissue coverage in case the infection tracked into  the bone.  We then undermined the subcutaneous layer, which had an  obvious plane from where the purulent drainage was coming from. We tried  to express this of any extra purulence posteromedially and  anterolaterally as we could over the bone and then we irrigated the  area, and packed it with a saline dressing.  We did explore the sinus  tract and did not track any deeper in the posteromedial aspect of the  leg.   The tourniquet was released after the sterile dressing was applied.   The postoperative plan is to resume the vancomycin, perform dressing  change in 48 hours, assess the wound, assess if need for further wound  debridements.  Of course,  check the cultures, repeat sed rates, and  C-  reactive proteins over the next week.      Vickki Hearing, M.D.  Electronically Signed     SEH/MEDQ  D:  08/19/2007  T:  08/19/2007  Job:  [id number]

## 2010-09-15 NOTE — H&P (Signed)
NAME:  Jeffrey, Huber NO.:  [id number]   MEDICAL RECORD NO.:  [id number]          PATIENT TYPE:  INP   LOCATION:  A329                          FACILITY:  [facility]   PHYSICIAN:  Vickki Hearing, M.D.DATE OF BIRTH:  1981/03/19   DATE OF ADMISSION:  DATE OF DISCHARGE:  LH                              HISTORY & PHYSICAL   CHIEF COMPLAINT:  Pain, left tibia with drainage.   HISTORY:  Jeffrey Huber is a 30 year old male, who was injured while logging.  A tree snapped, hit his left tibia.  He sustained a left tib/fib closed  fracture with a moderate size butterfly fragment.  He was brought into  the hospital on March 12, the date of injury, and had surgery on March  13 with a closed intramedullary nailing with a Synthes locked nail.  The  patient did well postoperatively, had essentially no major problems, was  discharged home.  He was seen postoperatively, radiographs were  obtained.  He was doing fine; however, on April 2, he presented to the  emergency room complaining of pain after he fell re-injuring his left  leg, although there was no deformity and normal radiographs were noted.  He was sent home on Lortab 10 mg and then presented back to the ER again  on April 15 at [facility] asking for more pain medication.  Dr.  Hilda Lias had seen him in my absence and started him on IV vancomycin due  to what was thought to be a soft tissue infection over the mid tibia.   His lab results on April 9 showed a sed rate of 1 and a white count of 7  with no left shift.  His new labs show a sed rate of 5 and a C-reactive  protein of 0.4, normal being greater than 0.6.  He is still afebrile.   Of note, the patient has had admission for drug overdose on July 05, 2007, was treated in the hospital.   At this point, it appears that the infection is superficial and not in  the intramedullary canal.  We will do an incision and drainage of the  area of maximal tenderness and  drainage and then pack the wound and most  likely do a secondary closure.   He does not have any allergies.  He has never had any other surgery.  He  lives in [place].  He is separated, has two children.   FAMILY HISTORY:  Notable for heart disease, emphysema, and lung cancer.   REVIEW OF SYSTEMS:  Other than his psychiatric and drug abuse history is  negative.   PHYSICAL EXAMINATION:  His vital signs remain normal with a temperature  of 97, a pulse of 82, a respiratory rate of 20, a blood pressure of  132/72.  His appearance is normal.  He has a muscular build.  He has  good pulse perfusion and flow with no peripheral edema in his  cardiovascular system.   Skin is notable for pinpoint area of drainage on the posteromedial  aspect of the left leg.  There is surrounding erythema.  This area is  approximately 5 cm x 4 cm in length and width.  It does not progress  proximally or distally.  There is no drainage from any of the surgical  incisions.  There is no redness or tenderness there.   Neurologic exam shows an awake, alert male, mood and affect intact.  Good sensation in all 4 extremities and coordination is normal.  Performs crutch ambulation.   Lymph nodes; he did have some pain in his left groin, although no lymph  nodes were palpable.   Upper extremities show normal range of motion, strength, and alignment  without contracture, subluxation, atrophy, or tremor.  The right lower  extremity is well-aligned with good muscle tone, normal range of motion  and strength and no evidence of muscle atrophy or joint contracture.   Of note, his left foot is in neutral position with good dorsiflexion and  plantar flexion.  Knee range of motion is 0-115.   IMPRESSION:  Of note, the patient started walking with one crutch after  he got his PICC line, although he was told to be nonweightbearing.  He  was told that the PICC line would clot off if he used crutches.  I  informed him  again that he has to follow my instructions, and they are  to use crutches without weightbearing.   DIAGNOSIS:  Soft tissue infection after closed intramedullary nailing  left tib/fib fracture.  Plan is for incision and drainage.  The patient  will be on antibiotics for Wednesday.  We will hold them Thursday to get  new cultures on Friday, when the surgery is done.      Vickki Hearing, M.D.  Electronically Signed     SEH/MEDQ  D:  08/17/2007  T:  08/17/2007  Job:  [id number]

## 2010-09-15 NOTE — Op Note (Signed)
NAME:  Jeffrey Huber, Jeffrey Huber NO.:  [id number]   MEDICAL RECORD NO.:  [id number]          PATIENT TYPE:  AMB   LOCATION:  DAY                           FACILITY:  [facility]   PHYSICIAN:  Vickki Hearing, M.D.DATE OF BIRTH:  1980/10/15   DATE OF PROCEDURE:  02/16/2008  DATE OF DISCHARGE:                               OPERATIVE REPORT   PREOPERATIVE DIAGNOSIS:  Fracture, closed left tib-fib.   POSTOPERATIVE DIAGNOSIS:  Fracture, closed left tib-fib.   PROCEDURE:  Removal of proximal locking bolts, left tibial nail.   SURGEON:  Vickki Hearing, MD.   ASSISTANT:  Dr. [place].   ANESTHETIC:  General.   FINDINGS:  Two intact screws confirmed to be removed in total by x-ray.   HISTORY:  Jeffrey Huber had IM nail on July 14, 2007 for injury  sustained on July 13, 2007, secondary to a logging accident.  He had a  closed tib-fib fracture.  He developed distal midshaft abscess on August 19, 2007, which was treated with IV vancomycin and an incision and  drainage and he improved.  He complains of pain over the proximal  locking bolts and it was suggested that the bolts to be removed once the  fracture had enough healing.  Radiographs taken on January 31, 2008,  showed that the fracture was healing and that the screws could be  removed.   There were no complications.  Counts were correct.  The patient went to  PACU in good condition.  Two screws were saved for the patient and given  to him.  X-rays were taken in the operating room to show that screws  were removed intact.   The patient was first identified preoperatively, he had some vancomycin,  had vomiting, so we stopped and gave him Ancef instead.  He was taken to  the operating room for general anesthesia and left leg was prepped with  DuraPrep and draped sterilely.  Tourniquet was elevated at 300 mmHg.  A  time-out procedure was completed.   An incision was made over the 2 screws, divided down to the  screw heads.  Bleeding was controlled with electrocautery.  Blunt dissection was  needed as well as sharp dissection.  Once the screws were found, they  were removed intact.  The wounds were irrigated and then closed with 2-0  Monocryl in 2 layers and staples.  We injected 10 mL of Marcaine with  epinephrine around the wound, applied a sterile dressing with tourniquet  down.  The patient was taken to recovery room in stable condition.   POSTOPERATIVE PLAN:  Pain Lorcet 10/650 one q.4 p.r.n. as needed for  pain, okay to take 2 at night.  He is full weightbearing, have to follow  up on February 20, 2008, at 2:30 p.m.      Vickki Hearing, M.D.  Electronically Signed     SEH/MEDQ  D:  02/16/2008  T:  02/16/2008  Job:  [id number]

## 2010-12-08 ENCOUNTER — Emergency Department (HOSPITAL_COMMUNITY)
Admission: EM | Admit: 2010-12-08 | Discharge: 2010-12-08 | Disposition: A | Discharge status: Home / Self Care | Payer: Self-pay | Attending: Emergency Medicine | Admitting: Emergency Medicine

## 2010-12-08 ENCOUNTER — Emergency Department (HOSPITAL_COMMUNITY): Payer: Self-pay

## 2010-12-08 DIAGNOSIS — M25569 Pain in unspecified knee: Secondary | ICD-10-CM | POA: Insufficient documentation

## 2010-12-08 DIAGNOSIS — X58XXXA Exposure to other specified factors, initial encounter: Secondary | ICD-10-CM | POA: Insufficient documentation

## 2010-12-08 DIAGNOSIS — M79609 Pain in unspecified limb: Secondary | ICD-10-CM | POA: Insufficient documentation

## 2010-12-08 DIAGNOSIS — Z9889 Other specified postprocedural states: Secondary | ICD-10-CM | POA: Insufficient documentation

## 2010-12-30 ENCOUNTER — Emergency Department (HOSPITAL_COMMUNITY)
Admission: EM | Admit: 2010-12-30 | Discharge: 2010-12-30 | Disposition: A | Discharge status: Home / Self Care | Payer: Self-pay | Attending: Emergency Medicine | Admitting: Emergency Medicine

## 2010-12-30 DIAGNOSIS — S058X9A Other injuries of unspecified eye and orbit, initial encounter: Secondary | ICD-10-CM | POA: Insufficient documentation

## 2010-12-30 DIAGNOSIS — IMO0002 Reserved for concepts with insufficient information to code with codable children: Secondary | ICD-10-CM | POA: Insufficient documentation

## 2010-12-30 DIAGNOSIS — Y92009 Unspecified place in unspecified non-institutional (private) residence as the place of occurrence of the external cause: Secondary | ICD-10-CM | POA: Insufficient documentation

## 2011-01-25 LAB — LIPASE, BLOOD: Lipase: 17

## 2011-01-25 LAB — RAPID URINE DRUG SCREEN, HOSP PERFORMED
Benzodiazepines: POSITIVE — AB
Cocaine: NOT DETECTED
Opiates: POSITIVE — AB

## 2011-01-25 LAB — BASIC METABOLIC PANEL
BUN: 11
BUN: 5 — ABNORMAL LOW
Chloride: 101
Creatinine, Ser: 0.95
GFR calc Af Amer: >60
GFR calc non Af Amer: >60
GFR calc non Af Amer: >60
Potassium: 3.5
Potassium: 3.9

## 2011-01-25 LAB — DIFFERENTIAL
Basophils Absolute: 0
Basophils Relative: 0
Eosinophils Absolute: 0.3
Eosinophils Relative: 2
Eosinophils Relative: 4
Lymphocytes Relative: 11 — ABNORMAL LOW
Lymphocytes Relative: 36
Lymphs Abs: 1.3
Monocytes Relative: 6
Neutro Abs: 9.4 — ABNORMAL HIGH
Neutrophils Relative %: 33 — ABNORMAL LOW
Neutrophils Relative %: 84 — ABNORMAL HIGH

## 2011-01-25 LAB — CBC
HCT: 38.4 — ABNORMAL LOW
HCT: 45.6
Hemoglobin: 13.4
MCV: 86.6
MCV: 87.3
Platelets: 222
Platelets: 288
RBC: 4.44
RBC: 5.22
WBC: 10.6 — ABNORMAL HIGH
WBC: 11.2 — ABNORMAL HIGH
WBC: 8.6

## 2011-01-25 LAB — ACETAMINOPHEN LEVEL
Acetaminophen (Tylenol), Serum: <10 — ABNORMAL LOW
Acetaminophen (Tylenol), Serum: <10 — ABNORMAL LOW

## 2011-01-25 LAB — COMPREHENSIVE METABOLIC PANEL
AST: 21
CO2: 28
Chloride: 101
Creatinine, Ser: 0.81
GFR calc Af Amer: >60
GFR calc non Af Amer: >60
Glucose, Bld: 89
Total Bilirubin: 0.9

## 2011-01-25 LAB — HEPATIC FUNCTION PANEL
ALT: 23
Alkaline Phosphatase: 88
Bilirubin, Direct: 0.1
Indirect Bilirubin: 0.4
Total Bilirubin: 0.5

## 2011-01-25 LAB — URINALYSIS, ROUTINE W REFLEX MICROSCOPIC
Bilirubin Urine: NEGATIVE
Glucose, UA: NEGATIVE
Hgb urine dipstick: NEGATIVE
Nitrite: NEGATIVE
Specific Gravity, Urine: 1.015
pH: 6.5

## 2011-01-25 LAB — ETHANOL: Alcohol, Ethyl (B): 273 — ABNORMAL HIGH

## 2011-01-25 LAB — SALICYLATE LEVEL: Salicylate Lvl: <4

## 2011-01-26 LAB — DIFFERENTIAL
Eosinophils Absolute: 0.3
Eosinophils Absolute: 0.3
Eosinophils Relative: 4
Eosinophils Relative: 3
Eosinophils Relative: 4
Lymphocytes Relative: 42
Lymphocytes Relative: 33
Lymphs Abs: 3
Lymphs Abs: 2.2
Lymphs Abs: 2.3
Monocytes Absolute: 0.5
Monocytes Absolute: 0.5
Monocytes Absolute: 0.6
Monocytes Relative: 7
Monocytes Relative: 6
Neutrophils Relative %: 69

## 2011-01-26 LAB — CBC
HCT: 40.3
HCT: 36.7 — ABNORMAL LOW
HCT: 38.9 — ABNORMAL LOW
Hemoglobin: 13.6
MCHC: 34.9
MCHC: 35.3
MCV: 88.4
MCV: 86.9
MCV: 87.6
RBC: 4.56
RBC: 4.22
RBC: 4.44
WBC: 7
WBC: 7

## 2011-01-26 LAB — ANAEROBIC CULTURE: Gram Stain: NONE SEEN

## 2011-01-26 LAB — BASIC METABOLIC PANEL
BUN: 6
BUN: 5 — ABNORMAL LOW
CO2: 27
CO2: 27
Chloride: 101
Chloride: 104
Potassium: 3.6
Potassium: 3 — ABNORMAL LOW

## 2011-01-26 LAB — COMPREHENSIVE METABOLIC PANEL
AST: 26
BUN: 5 — ABNORMAL LOW
CO2: 26
Chloride: 105
Creatinine, Ser: 0.69
GFR calc Af Amer: >60
GFR calc non Af Amer: >60
Total Bilirubin: 0.4

## 2011-01-26 LAB — VANCOMYCIN, TROUGH: Vancomycin Tr: <5 — ABNORMAL LOW

## 2011-01-26 LAB — SEDIMENTATION RATE: Sed Rate: 1

## 2011-02-01 LAB — HEMOGLOBIN AND HEMATOCRIT, BLOOD: Hemoglobin: 15.2

## 2012-06-14 ENCOUNTER — Emergency Department (HOSPITAL_COMMUNITY): Payer: Self-pay

## 2012-06-14 ENCOUNTER — Encounter (HOSPITAL_COMMUNITY): Payer: Self-pay

## 2012-06-14 ENCOUNTER — Emergency Department (HOSPITAL_COMMUNITY)
Admission: EM | Admit: 2012-06-14 | Discharge: 2012-06-14 | Disposition: A | Discharge status: Home / Self Care | Payer: Self-pay | Source: Home / Self Care | Attending: Family Medicine | Admitting: Family Medicine

## 2012-06-14 ENCOUNTER — Emergency Department (INDEPENDENT_AMBULATORY_CARE_PROVIDER_SITE_OTHER): Payer: Self-pay

## 2012-06-14 ENCOUNTER — Emergency Department (HOSPITAL_COMMUNITY)
Admission: EM | Admit: 2012-06-14 | Discharge: 2012-06-14 | Disposition: A | Discharge status: Home / Self Care | Payer: Self-pay | Attending: Emergency Medicine | Admitting: Emergency Medicine

## 2012-06-14 DIAGNOSIS — R109 Unspecified abdominal pain: Secondary | ICD-10-CM | POA: Insufficient documentation

## 2012-06-14 DIAGNOSIS — K219 Gastro-esophageal reflux disease without esophagitis: Secondary | ICD-10-CM

## 2012-06-14 DIAGNOSIS — R079 Chest pain, unspecified: Secondary | ICD-10-CM

## 2012-06-14 DIAGNOSIS — K5900 Constipation, unspecified: Secondary | ICD-10-CM | POA: Insufficient documentation

## 2012-06-14 DIAGNOSIS — R0781 Pleurodynia: Secondary | ICD-10-CM

## 2012-06-14 DIAGNOSIS — R110 Nausea: Secondary | ICD-10-CM

## 2012-06-14 LAB — RAPID URINE DRUG SCREEN, HOSP PERFORMED
Benzodiazepines: NOT DETECTED
Cocaine: NOT DETECTED
Opiates: NOT DETECTED
Tetrahydrocannabinol: NOT DETECTED

## 2012-06-14 LAB — CBC
HCT: 48.3 % (ref 39.0–52.0)
Hemoglobin: 17 g/dL (ref 13.0–17.0)
MCH: 29.8 pg (ref 26.0–34.0)
MCHC: 35.2 g/dL (ref 30.0–36.0)

## 2012-06-14 LAB — COMPREHENSIVE METABOLIC PANEL
Albumin: 4.6 g/dL (ref 3.5–5.2)
Alkaline Phosphatase: 77 U/L (ref 39–117)
BUN: 18 mg/dL (ref 6–23)
Chloride: 104 mEq/L (ref 96–112)
Potassium: 4.3 mEq/L (ref 3.5–5.1)
Total Bilirubin: 0.9 mg/dL (ref 0.3–1.2)

## 2012-06-14 LAB — POCT URINALYSIS DIP (DEVICE)
Ketones, ur: NEGATIVE mg/dL
Leukocytes, UA: NEGATIVE
Protein, ur: NEGATIVE mg/dL
Urobilinogen, UA: 0.2 mg/dL (ref 0.0–1.0)
pH: 7 (ref 5.0–8.0)

## 2012-06-14 LAB — LIPASE, BLOOD: Lipase: 27 U/L (ref 11–59)

## 2012-06-14 LAB — HEMOGLOBIN A1C: Hgb A1c MFr Bld: 5.1 % (ref <5.7)

## 2012-06-14 MED ORDER — IOHEXOL 300 MG/ML  SOLN
100.0000 mL | Freq: Once | INTRAMUSCULAR | Status: AC | PRN
  Administered 2012-06-14: 100 mL via INTRAVENOUS

## 2012-06-14 MED ORDER — OMEPRAZOLE 20 MG PO TBEC: 1.0000 | DELAYED_RELEASE_TABLET | Freq: Every morning | ORAL | Status: DC

## 2012-06-14 MED ORDER — IOHEXOL 300 MG/ML  SOLN
50.0000 mL | Freq: Once | INTRAMUSCULAR | Status: AC | PRN
  Administered 2012-06-14: 50 mL via ORAL

## 2012-06-14 MED ORDER — GI COCKTAIL ~~LOC~~
ORAL | Status: AC
  Filled 2012-06-14: qty 30

## 2012-06-14 MED ORDER — GI COCKTAIL ~~LOC~~
30.0000 mL | Freq: Once | ORAL | Status: AC
  Administered 2012-06-14: 30 mL via ORAL

## 2012-06-14 MED ORDER — POLYETHYLENE GLYCOL 3350 17 GM/SCOOP PO POWD: 17.0000 g | Freq: Every day | ORAL | Status: DC

## 2012-06-14 NOTE — ED Provider Notes (Signed)
History     CSN: [id number]  Arrival date & time 06/14/12  1247   First MD Initiated Contact with Patient 06/14/12 1324      Chief Complaint  Patient presents with  . Constipation    SBO    HPI Patient was seen just prior to arrival at urgent care Center where x-ray was done which showed questionable early small bowel obstruction.  Patient was given some MiraLAX.  Has had a change in bowel habits the last few days.  Patient denies any vomiting but has had nausea.  Patient denies any fever or chills. History reviewed. No pertinent past medical history.  History reviewed. No pertinent past surgical history.  No family history on file.  History  Substance Use Topics  . Smoking status: Never Smoker   . Smokeless tobacco: Not on file  . Alcohol Use: No      Review of Systems All other systems reviewed and are negative Allergies  Vancomycin  Home Medications   Current Outpatient Rx  Name  Route  Sig  Dispense  Refill  . Omeprazole 20 MG TBEC   Oral   Take 1 tablet (20 mg total) by mouth every morning.   30 each   1   . polyethylene glycol powder (MIRALAX) powder   Oral   Take 17 g by mouth daily.   255 g   1     BP 125/66  Pulse 83  Temp(Src) 98.1 F (36.7 C) (Oral)  Resp 16  SpO2 98%  Physical Exam  Nursing note and vitals reviewed. Constitutional: He is oriented to person, place, and time. He appears well-developed and well-nourished. No distress.  HENT:  Head: Normocephalic and atraumatic.  Eyes: Pupils are equal, round, and reactive to light.  Neck: Normal range of motion.  Cardiovascular: Normal rate and intact distal pulses.   Pulmonary/Chest: No respiratory distress.  Abdominal: Normal appearance. He exhibits no distension. There is no tenderness. There is no rebound and no guarding.  Musculoskeletal: Normal range of motion.  Neurological: He is alert and oriented to person, place, and time. No cranial nerve deficit.  Skin: Skin is warm and  dry. No rash noted.  Psychiatric: He has a normal mood and affect. His behavior is normal.    ED Course  Procedures (including critical care time)  Labs Reviewed - No data to display No results found. CT Abdomen Pelvis W Contrast (Final result)  Result time: 06/14/12 15:58:46    Final result by Rad Results In Interface (06/14/12 15:58:46)    Narrative:   *RADIOLOGY REPORT*  Clinical Data: Abdominal pain for 2 weeks, right side abdominal cramping and nausea  CT ABDOMEN AND PELVIS WITH CONTRAST  Technique: Multidetector CT imaging of the abdomen and pelvis was performed following the standard protocol during bolus administration of intravenous contrast. Sagittal and coronal MPR images reconstructed from axial data set.  Contrast: OMNIPAQUE IOHEXOL 300 MG/ML SOLN, 50mL OMNIPAQUE IOHEXOL 300 MG/ML SOLN orally  Comparison: None  Findings: Lung bases clear. Liver, spleen, pancreas, kidneys, and adrenal glands unremarkable. Normal appendix. Ureters and bladder normal appearance. Stomach and bowel loops grossly normal appearance. No mass, adenopathy, free fluid or inflammatory process. No hernia or acute bony lesion. Bone island right femoral neck.  IMPRESSION: No acute intra abdominal or intrapelvic abnormalities.      1. Abdominal pain   2. Constipation       MDM         Nelia Shi, MD 06/17/12  1005 

## 2012-06-14 NOTE — ED Provider Notes (Signed)
History     CSN: [id number]  Arrival date & time 06/14/12  1008   First MD Initiated Contact with Patient 06/14/12 1033      Chief Complaint  Patient presents with  . Mass    (Consider location/radiation/quality/duration/timing/severity/associated sxs/prior treatment) HPI Pt reports that he has developed that he has a small knot that has developed on his left rib cage.   Pt says that he works out and it occurred after he was lifting weights.  He says that he has had more aching pain in the area and reports aching in the area as he has been stretching.  He also reports nausea and change in bowel habits.  Pt says that he has been drinking a lot of protein shakes.  He says that he has been cutting down on the protein and reports that he consumes a lot of calories everyday.  The patient says that his stools have changed.  He says that he has had no vomiting recently.   Pt says that he had an episode of diarrhea about a month ago for 3 days and says that he had some nausea this past weekend.  No blood or mucus seen in stool.  He is eating and drinking well but says that he does not have an appetite.  He says that he is normally not constipated but says that he had been having bowel movements twice per day but has been having bowel movements every other day for last couple of days.    History reviewed. No pertinent past medical history.  History reviewed. No pertinent past surgical history.  No family history on file.  History  Substance Use Topics  . Smoking status: Never Smoker   . Smokeless tobacco: Not on file  . Alcohol Use: No    Review of Systems  Constitutional: Negative for unexpected weight change.  Gastrointestinal: Positive for nausea, abdominal pain and constipation. Negative for vomiting, blood in stool and anal bleeding.       Left upper quadrant and epigastric abdominal pain Acid reflux  Skin: Negative for rash.  All other systems reviewed and are  negative.    Allergies  Vancomycin  Home Medications  No current outpatient prescriptions on file.  BP 117/74  Pulse 74  Temp(Src) 97.8 F (36.6 C) (Oral)  SpO2 100%  Physical Exam  Vitals reviewed. Constitutional: He is oriented to person, place, and time. He appears well-developed and well-nourished. No distress.  HENT:  Head: Normocephalic and atraumatic.  Right Ear: External ear normal.  Left Ear: External ear normal.  Eyes: Conjunctivae and EOM are normal. Pupils are equal, round, and reactive to light.  Neck: Normal range of motion. Neck supple.  Cardiovascular: Normal rate, regular rhythm and normal heart sounds.   Pulmonary/Chest: Effort normal and breath sounds normal.  Abdominal: Soft. Bowel sounds are normal. He exhibits no distension. There is tenderness.  RUQ TTP, Epigastric TTP, no guarding, no rebound No radiation of pain into back  Musculoskeletal: Normal range of motion.  Neurological: He is alert and oriented to person, place, and time.  Skin: Skin is warm and dry. No rash noted. No erythema. No pallor.  Psychiatric: He has a normal mood and affect. His behavior is normal. Judgment and thought content normal.    ED Course  Procedures (including critical care time)  Labs Reviewed - No data to display No results found.   No diagnosis found.  MDM  IMPRESSION  GERD  RUQ Abdominal Pain  Right  side pain   Right rib cage pain and tenderness  RECOMMENDATIONS / PLAN Xray suspicious for partial SBO.  Pt is not having any vomiting and has been eating and drinking and reports that he had a BM this morning.  He is passing flatus.  Will place patient on clear liquid diet for the next 2 days and advance to soft diet and have him follow up closely in the clinic but I advised him to go to ER if he develops vomiting, can't keep down fluids, starts to develop malaise or severe cramping or abdominal pain and the patient verbalized understanding.    FOLLOW  UP 5 days for recheck and repeat KUB   The patient was given clear instructions to go to ER or return to medical center if symptoms don't improve, worsen or new problems develop.  The patient verbalized understanding.  The patient was told to call to get lab results if they haven't heard anything in the next week.            Cleora Fleet, MD 06/14/12 1555

## 2012-06-14 NOTE — ED Notes (Signed)
Lump on right side near rib cage Feels achy in the area Nausea Frequent bowel movements

## 2012-06-14 NOTE — ED Provider Notes (Signed)
Received patient in sign out to check CT. No acute abnormality. Will d/c.   Juliet Rude. Rubin Payor, MD 06/14/12 1616

## 2012-06-14 NOTE — ED Notes (Signed)
Pt c/o abd pain x2wks, seen at UC this am sent here d/t dx SBO; pt c/o rt side abdominal cramping with nausea

## 2012-06-16 NOTE — Progress Notes (Signed)
Quick Note:  Please inform patient that labs came back OK.   Rodney Langton, MD, CDE, FAAFP [facility] [place], [place]   ______

## 2012-06-19 ENCOUNTER — Telehealth (HOSPITAL_COMMUNITY): Payer: Self-pay

## 2012-06-19 NOTE — Telephone Encounter (Signed)
Message copied by Lestine Mount on Mon Jun 19, 2012  9:37 AM ------      Message from: Cleora Fleet      Created: Fri Jun 16, 2012  9:59 AM       Please inform patient that labs came back OK.             Rodney Langton, MD, CDE, FAAFP      [facility]      [facility]      [place], [place]        ------

## 2012-11-24 ENCOUNTER — Ambulatory Visit: Payer: Self-pay

## 2013-07-30 ENCOUNTER — Ambulatory Visit: Payer: Self-pay | Admitting: Internal Medicine

## 2013-11-21 ENCOUNTER — Ambulatory Visit: Payer: Self-pay | Admitting: Internal Medicine

## 2013-12-10 ENCOUNTER — Ambulatory Visit: Payer: Self-pay | Admitting: Internal Medicine

## 2014-01-23 ENCOUNTER — Emergency Department (INDEPENDENT_AMBULATORY_CARE_PROVIDER_SITE_OTHER)
Admission: EM | Admit: 2014-01-23 | Discharge: 2014-01-23 | Disposition: A | Discharge status: Home / Self Care | Payer: Self-pay | Source: Home / Self Care | Attending: Family Medicine | Admitting: Family Medicine

## 2014-01-23 ENCOUNTER — Encounter (HOSPITAL_COMMUNITY): Payer: Self-pay | Admitting: Emergency Medicine

## 2014-01-23 DIAGNOSIS — J0100 Acute maxillary sinusitis, unspecified: Secondary | ICD-10-CM

## 2014-01-23 DIAGNOSIS — H109 Unspecified conjunctivitis: Secondary | ICD-10-CM

## 2014-01-23 MED ORDER — OLOPATADINE HCL 0.2 % OP SOLN: OPHTHALMIC | Status: DC

## 2014-01-23 MED ORDER — ERYTHROMYCIN 5 MG/GM OP OINT: 1.0000 "application " | TOPICAL_OINTMENT | Freq: Every day | OPHTHALMIC | Status: DC

## 2014-01-23 MED ORDER — IPRATROPIUM BROMIDE 0.06 % NA SOLN: 2.0000 | Freq: Four times a day (QID) | NASAL | Status: DC

## 2014-01-23 MED ORDER — AMOXICILLIN-POT CLAVULANATE 875-125 MG PO TABS: 1.0000 | ORAL_TABLET | Freq: Two times a day (BID) | ORAL | Status: DC

## 2014-01-23 NOTE — Discharge Instructions (Signed)
You are likely suffering from a mixed bacterial and viral conjunctivitis Please start the erythromycin oinment If the pataday is too expensive then try Zatidor Please star tthe augmentin for the sinus infection Please consider using the following for your symptoms - ibuprofen/advil 600-800mg  every 6-8 hrs, mucinex, nasal saline, and flonase  Conjunctivitis Conjunctivitis is commonly called "pink eye." Conjunctivitis can be caused by bacterial or viral infection, allergies, or injuries. There is usually redness of the lining of the eye, itching, discomfort, and sometimes discharge. There may be deposits of matter along the eyelids. A viral infection usually causes a watery discharge, while a bacterial infection causes a yellowish, thick discharge. Pink eye is very contagious and spreads by direct contact. You may be given antibiotic eyedrops as part of your treatment. Before using your eye medicine, remove all drainage from the eye by washing gently with warm water and cotton balls. Continue to use the medication until you have awakened 2 mornings in a row without discharge from the eye. Do not rub your eye. This increases the irritation and helps spread infection. Use separate towels from other household members. Wash your hands with soap and water before and after touching your eyes. Use cold compresses to reduce pain and sunglasses to relieve irritation from light. Do not wear contact lenses or wear eye makeup until the infection is gone. SEEK MEDICAL CARE IF:   Your symptoms are not better after 3 days of treatment.  You have increased pain or trouble seeing.  The outer eyelids become very red or swollen. Document Released: 05/27/2004 Document Revised: 07/12/2011 Document Reviewed: 04/19/2005 [facility] Patient Information 2015 [place], [place]. This information is not intended to replace advice given to you by your health care provider. Make sure you discuss any questions you have with your health  care provider.

## 2014-01-23 NOTE — ED Provider Notes (Addendum)
CSN: [id number]     Arrival date & time 01/23/14  1333 History   First MD Initiated Contact with Patient 01/23/14 1431     Chief Complaint  Patient presents with  . Eye Problem   (Consider location/radiation/quality/duration/timing/severity/associated sxs/prior Treatment) HPI Started 8 days ago w/ red eyes. Minimal discharge. Now w/ sinus pain and drainage. Son sick last week. Now w/ sore throat. Initially w/ L eye and now w/ R eye. Polytrim w/o benefit. Vision is blurry. Denies fevers.   History reviewed. No pertinent past medical history. History reviewed. No pertinent past surgical history. No family history on file. History  Substance Use Topics  . Smoking status: Never Smoker   . Smokeless tobacco: Not on file  . Alcohol Use: No    Review of Systems Per HPI with all other pertinent systems negative.   Allergies  Vancomycin  Home Medications   Prior to Admission medications   Medication Sig Start Date End Date Taking? Authorizing Provider  amoxicillin-clavulanate (AUGMENTIN) 875-125 MG per tablet Take 1 tablet by mouth 2 (two) times daily. 01/23/14   Ozella Rocks, MD  erythromycin ophthalmic ointment Place 1 application into both eyes at bedtime. Treat for 5 days 01/23/14   Ozella Rocks, MD  ipratropium (ATROVENT) 0.06 % nasal spray Place 2 sprays into both nostrils 4 (four) times daily. 01/23/14   Ozella Rocks, MD  Olopatadine HCl 0.2 % SOLN 1 drop daily both eyes 01/23/14   Ozella Rocks, MD  Omeprazole 20 MG TBEC Take 1 tablet (20 mg total) by mouth every morning. 06/14/12   Clanford Cyndie Mull, MD  polyethylene glycol powder (MIRALAX) powder Take 17 g by mouth daily. 06/14/12   Clanford L Johnson, MD   BP 145/84  Pulse 103  Temp(Src) 97.8 F (36.6 C) (Oral)  Resp 18  SpO2 97% Physical Exam  Constitutional: He appears well-developed and well-nourished. No distress.  HENT:  Head: Normocephalic and atraumatic.  bilat scleral injection EOMI No puffiness of  the surounding eyelids Maxillary sinuses very ttp  Eyes: EOM are normal. Pupils are equal, round, and reactive to light.  Neck: Normal range of motion.  Cardiovascular: Normal rate and normal heart sounds.   No murmur heard. Pulmonary/Chest: Effort normal and breath sounds normal. No respiratory distress. He has no wheezes. He has no rales. He exhibits no tenderness.  Musculoskeletal: Normal range of motion.  Neurological: He is alert.  Skin: Skin is warm. He is not diaphoretic.  Psychiatric: He has a normal mood and affect. His behavior is normal. Judgment and thought content normal.    ED Course  Procedures (including critical care time) Labs Review Labs Reviewed - No data to display  Imaging Review No results found.   MDM   1. Bilateral conjunctivitis   2. Acute maxillary sinusitis, recurrence not specified    Start augmentin Erythromycin ointment for eyes Pataday for red relief Start nasal saline, NSAIDs (  Q6), nasal atrovent, and or flonase Precautions given and all questions answered   Shelly Flatten, MD Family Medicine 01/23/2014, 3:07 PM      Ozella Rocks, MD 01/23/14 1507  Ozella Rocks, MD 01/23/14 [phone]

## 2014-01-23 NOTE — ED Notes (Addendum)
C/o bilateral eye redness onset 9 days Sx include: irritation, watery, blurry vision Using left over Polytrim his son had.  Also reports feeling "sluggish" over all Alert, no signs of acute distress.

## 2014-02-20 ENCOUNTER — Ambulatory Visit: Payer: Self-pay | Admitting: Family Medicine

## 2014-06-09 ENCOUNTER — Encounter (HOSPITAL_COMMUNITY): Payer: Self-pay | Admitting: *Deleted

## 2014-06-09 ENCOUNTER — Emergency Department (INDEPENDENT_AMBULATORY_CARE_PROVIDER_SITE_OTHER)
Admission: EM | Admit: 2014-06-09 | Discharge: 2014-06-09 | Disposition: A | Discharge status: Home / Self Care | Payer: Self-pay | Source: Home / Self Care | Attending: Emergency Medicine | Admitting: Emergency Medicine

## 2014-06-09 DIAGNOSIS — B029 Zoster without complications: Secondary | ICD-10-CM

## 2014-06-09 MED ORDER — TRAMADOL HCL 50 MG PO TABS: 50.0000 mg | ORAL_TABLET | Freq: Four times a day (QID) | ORAL | Status: DC | PRN

## 2014-06-09 NOTE — Discharge Instructions (Signed)

## 2014-06-09 NOTE — ED Provider Notes (Signed)
CSN: [id number]     Arrival date & time 06/09/14  1005 History   First MD Initiated Contact with Patient 06/09/14 1037     Chief Complaint  Patient presents with  . Rash   (Consider location/radiation/quality/duration/timing/severity/associated sxs/prior Treatment)  HPI   Patient is a 34 year old male presenting today with complaints of itching and burning tingling rash which started approximately 9 days ago. Patient states onset of a vesicular type rash around the right side of his abdomen approximately 8 days ago. Patient state is exceptionally painful, but did not think it was shingles because did not think he had had chickenpox as a child or was able to get it so young.   History reviewed. No pertinent past medical history. Past Surgical History  Procedure Laterality Date  . Leg surgery      fracture tibia/fibula   History reviewed. No pertinent family history. History  Substance Use Topics  . Smoking status: Never Smoker   . Smokeless tobacco: Not on file  . Alcohol Use: Yes     Comment: occasional    Review of Systems  Constitutional: Negative.  Negative for fever and fatigue.  HENT: Negative.  Negative for sore throat.   Eyes: Negative.   Respiratory: Negative.  Negative for shortness of breath.   Cardiovascular: Negative.   Gastrointestinal: Positive for nausea. Negative for vomiting and diarrhea.  Endocrine: Negative.   Genitourinary: Negative.   Skin: Positive for rash.  Allergic/Immunologic: Negative.   Neurological: Negative.  Negative for headaches.  Hematological: Negative.   Psychiatric/Behavioral: Negative.     Allergies  Vancomycin  Home Medications   Prior to Admission medications   Medication Sig Start Date End Date Taking? Authorizing Provider  DHEA 50 MG TABS Take 2 tablets by mouth.   Yes Historical Provider, MD  Multiple Vitamins-Minerals (MULTIVITAMIN WITH MINERALS) tablet Take 1 tablet by mouth daily.   Yes Historical Provider, MD    sildenafil (VIAGRA) 25 MG tablet Take 25 mg by mouth daily as needed for erectile dysfunction.   Yes Historical Provider, MD  amoxicillin-clavulanate (AUGMENTIN) 875-125 MG per tablet Take 1 tablet by mouth 2 (two) times daily. 01/23/14   Ozella Rocksavid J Merrell, MD  erythromycin ophthalmic ointment Place 1 application into both eyes at bedtime. Treat for 5 days 01/23/14   Ozella Rocksavid J Merrell, MD  ipratropium (ATROVENT) 0.06 % nasal spray Place 2 sprays into both nostrils 4 (four) times daily. 01/23/14   Ozella Rocksavid J Merrell, MD  Olopatadine HCl 0.2 % SOLN 1 drop daily both eyes 01/23/14   Ozella Rocksavid J Merrell, MD  Omeprazole 20 MG TBEC Take 1 tablet (20 mg total) by mouth every morning. 06/14/12   Clanford Cyndie MullL Johnson, MD  polyethylene glycol powder (MIRALAX) powder Take 17 g by mouth daily. 06/14/12   Clanford Cyndie MullL Johnson, MD  traMADol (ULTRAM) 50 MG tablet Take 1 tablet (50 mg total) by mouth every 6 (six) hours as needed. 06/09/14   Servando Salinaatherine H Rossi, NP   BP 112/74 mmHg  Pulse 78  Temp(Src) 98 F (36.7 C) (Oral)  Resp 18  SpO2 100%   Physical Exam  Constitutional: He is oriented to person, place, and time. He appears well-developed and well-nourished. No distress.  HENT:  Head: Normocephalic and atraumatic.  Neck: Normal range of motion. Neck supple.  Negative nuchal rigidity.  Cardiovascular: Normal rate, regular rhythm, normal heart sounds and intact distal pulses.  Exam reveals no gallop and no friction rub.   No murmur heard. Pulmonary/Chest: Effort normal  and breath sounds normal. No respiratory distress. He has no wheezes. He has no rales. He exhibits no tenderness.  Lymphadenopathy:    He has no cervical adenopathy.  Neurological: He is alert and oriented to person, place, and time.  Skin: Skin is warm and dry. Rash noted. He is not diaphoretic.  Zosterform painful rash consistent with approximate T12-L1 distribution of right flank.  Vesicles have begun to scab.  See attached images.    Nursing note and  vitals reviewed.             ED Course  Procedures (including critical care time) Labs Review Labs Reviewed - No data to display  Imaging Review No results found.   MDM   1. Shingles    Meds ordered this encounter  Medications  . Multiple Vitamins-Minerals (MULTIVITAMIN WITH MINERALS) tablet    Sig: Take 1 tablet by mouth daily.  Marland Kitchen DHEA 50 MG TABS    Sig: Take 2 tablets by mouth.  . sildenafil (VIAGRA) 25 MG tablet    Sig: Take 25 mg by mouth daily as needed for erectile dysfunction.  . traMADol (ULTRAM) 50 MG tablet    Sig: Take 1 tablet (50 mg total) by mouth every 6 (six) hours as needed.    Dispense:  30 tablet    Refill:  0   Onset of rash was approximately 8-9 days ago with no new eruptions since then. Plan of care discussed with Dr. Piedad Climes. Agreed that unlikely for antiviral medications to be of much benefit. Patient given pain medication for discomfort. The patient verbalizes understanding and agrees to plan of care.       Servando Salina, NP 06/09/14 1200

## 2014-06-09 NOTE — ED Notes (Signed)
C/o rash to R abdomen, side and back on 1/29.  States it itches, burns and tingle.  Started on abdomen and spread around. Also has R spot on L chest.

## 2014-12-25 ENCOUNTER — Telehealth: Payer: Self-pay | Admitting: Orthopedic Surgery

## 2014-12-25 NOTE — Telephone Encounter (Signed)
Patient called 12/20/14,to inquire about scheduling appointment for his left leg (tib-fib), due to some recurring pain, and in calling back to ph#, voice mail automated does not accept messages.  Chart notes from patient's care during 2009 - 2010 exists in [facility] system as "medical record conversion" notes.  Date of surgery for work-related injury was 07/14/07.  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  Please advise regarding scheduling.  [phone]

## 2014-12-30 NOTE — Telephone Encounter (Signed)
If he has no insurance then we cant see him unless he pays out of pocket New ov and xrays =???

## 2015-02-03 NOTE — Telephone Encounter (Signed)
No further response from patient to schedule per Dr Mort Sawyers response.

## 2016-10-14 ENCOUNTER — Emergency Department (HOSPITAL_BASED_OUTPATIENT_CLINIC_OR_DEPARTMENT_OTHER)
Admission: EM | Admit: 2016-10-14 | Discharge: 2016-10-14 | Disposition: A | Discharge status: Home / Self Care | Payer: Self-pay | Attending: Physician Assistant | Admitting: Physician Assistant

## 2016-10-14 ENCOUNTER — Encounter (HOSPITAL_BASED_OUTPATIENT_CLINIC_OR_DEPARTMENT_OTHER): Payer: Self-pay | Admitting: Emergency Medicine

## 2016-10-14 DIAGNOSIS — Y999 Unspecified external cause status: Secondary | ICD-10-CM | POA: Insufficient documentation

## 2016-10-14 DIAGNOSIS — Z79899 Other long term (current) drug therapy: Secondary | ICD-10-CM | POA: Insufficient documentation

## 2016-10-14 DIAGNOSIS — Y93G1 Activity, food preparation and clean up: Secondary | ICD-10-CM | POA: Insufficient documentation

## 2016-10-14 DIAGNOSIS — S61209A Unspecified open wound of unspecified finger without damage to nail, initial encounter: Secondary | ICD-10-CM

## 2016-10-14 DIAGNOSIS — S61210A Laceration without foreign body of right index finger without damage to nail, initial encounter: Secondary | ICD-10-CM | POA: Insufficient documentation

## 2016-10-14 DIAGNOSIS — Y9259 Other trade areas as the place of occurrence of the external cause: Secondary | ICD-10-CM | POA: Insufficient documentation

## 2016-10-14 DIAGNOSIS — W3182XA Contact with other commercial machinery, initial encounter: Secondary | ICD-10-CM | POA: Insufficient documentation

## 2016-10-14 MED ORDER — TRANEXAMIC ACID 1000 MG/10ML IV SOLN
500.0000 mg | Freq: Once | INTRAVENOUS | Status: DC
  Filled 2016-10-14: qty 10

## 2016-10-14 NOTE — ED Triage Notes (Signed)
Injury  to L index finger tip from a meat slicer. Pt put coffee grounds on the wound in an attempt to stop the bleeding. Finger is still bleeding.

## 2016-10-14 NOTE — Discharge Instructions (Signed)
Return here as needed.  Follow-up with a primary doctor as needed.  The area clean and dry.  I would keep the area covered while working. you can allow it to exposed to the air at home

## 2016-10-14 NOTE — ED Provider Notes (Signed)
[facility] Provider Note   CSN: [id number] Arrival date & time: 10/14/16  0944     History   Chief Complaint Chief Complaint  Patient presents with  . Finger Injury    HPI Jeffrey Huber is a 36 y.o. male.  HPI Patient presents to the emergency department with a skin avulsion to the right pointer finger after cutting it on a meat slicer.  The patient states that he applied pressure to the wound to help stop the bleeding.  Patient denies any other injuries.  He states that he has full range of motion of the finger.  Patient denies any weakness or numbness in the finger History reviewed. No pertinent past medical history.  Patient Active Problem List   Diagnosis Date Noted  . ACHILLES TENDINITIS 02/20/2008  . CELLULITIS, LEG, LEFT 08/28/2007  . CLOSED FRACTURE OF SHAFT OF FIBULA WITH TIBIA 07/24/2007    Past Surgical History:  Procedure Laterality Date  . LEG SURGERY     fracture tibia/fibula       Home Medications    Prior to Admission medications   Medication Sig Start Date End Date Taking? Authorizing Provider  DHEA 50 MG TABS Take 2 tablets by mouth.    [provider]  Multiple Vitamins-Minerals (MULTIVITAMIN WITH MINERALS) tablet Take 1 tablet by mouth daily.    [provider]  sildenafil (VIAGRA) 25 MG tablet Take 25 mg by mouth daily as needed for erectile dysfunction.    [provider]    Family History No family history on file.  Social History Social History  Substance Use Topics  . Smoking status: Never Smoker  . Smokeless tobacco: Never Used  . Alcohol use Yes     Comment: occasional     Allergies   Vancomycin   Review of Systems Review of Systems  All other systems negative except as documented in the HPI. All pertinent positives and negatives as reviewed in the HPI. Physical Exam Updated Vital Signs BP 125/87 (BP Location: Left Arm)   Pulse 67   Temp 98.8 F (37.1 C) (Oral)   Resp 18    Ht 5\' 9"  (1.753 m)   Wt 88.5 kg (195 lb)   SpO2 100%   BMI 28.80 kg/m   Physical Exam  Constitutional: He is oriented to person, place, and time. He appears well-developed and well-nourished. No distress.  HENT:  Head: Normocephalic and atraumatic.  Eyes: Pupils are equal, round, and reactive to light.  Pulmonary/Chest: Effort normal.  Musculoskeletal:       Hands: Neurological: He is alert and oriented to person, place, and time.  Skin: Skin is warm and dry.  Psychiatric: He has a normal mood and affect.  Nursing note and vitals reviewed.    ED Treatments / Results  Labs (all labs ordered are listed, but only abnormal results are displayed) Labs Reviewed - No data to display  EKG  EKG Interpretation None       Radiology No results found.  Procedures Procedures (including critical care time)  Medications Ordered in ED Medications  tranexamic acid (CYKLOKAPRON) injection 500 mg (not administered)     Initial Impression / Assessment and Plan / ED Course  I have reviewed the triage vital signs and the nursing notes.  Pertinent labs & imaging results that were available during my care of the patient were reviewed by me and considered in my medical decision making (see chart for details).     Attempt, a gauze  and pressure dressing, which slowed the bleeding dramatically ultimately used hemostatic powder and pressure dressing and the bleeding is completely stopped.  Patient is advised to keep area clean and dry.  Told to return here as needed.  Advised him to leave the scabbed area in place.   Final Clinical Impressions(s) / ED Diagnoses   Final diagnoses:  None    New Prescriptions New Prescriptions   No medications on file     Charlestine Night, Cordelia Poche 10/14/16 1136    [place], Cindee Salt, MD 10/15/16 [phone]

## 2019-04-18 ENCOUNTER — Other Ambulatory Visit: Payer: Self-pay | Admitting: Physician Assistant

## 2019-04-18 DIAGNOSIS — S161XXA Strain of muscle, fascia and tendon at neck level, initial encounter: Secondary | ICD-10-CM

## 2019-04-18 DIAGNOSIS — S46091D Other injury of muscle(s) and tendon(s) of the rotator cuff of right shoulder, subsequent encounter: Secondary | ICD-10-CM

## 2019-04-18 DIAGNOSIS — G5601 Carpal tunnel syndrome, right upper limb: Secondary | ICD-10-CM

## 2019-04-18 DIAGNOSIS — S161XXS Strain of muscle, fascia and tendon at neck level, sequela: Secondary | ICD-10-CM

## 2019-04-18 DIAGNOSIS — S46091S Other injury of muscle(s) and tendon(s) of the rotator cuff of right shoulder, sequela: Secondary | ICD-10-CM

## 2019-04-18 DIAGNOSIS — M7701 Medial epicondylitis, right elbow: Secondary | ICD-10-CM

## 2019-04-18 DIAGNOSIS — S161XXD Strain of muscle, fascia and tendon at neck level, subsequent encounter: Secondary | ICD-10-CM

## 2019-05-14 ENCOUNTER — Ambulatory Visit
Admission: RE | Admit: 2019-05-14 | Discharge: 2019-05-14 | Disposition: A | Discharge status: Home / Self Care | Payer: Worker's Compensation | Source: Ambulatory Visit | Attending: Physician Assistant | Admitting: Physician Assistant

## 2019-05-14 ENCOUNTER — Other Ambulatory Visit: Payer: Self-pay

## 2019-05-14 DIAGNOSIS — G5601 Carpal tunnel syndrome, right upper limb: Secondary | ICD-10-CM

## 2019-05-14 DIAGNOSIS — S161XXS Strain of muscle, fascia and tendon at neck level, sequela: Secondary | ICD-10-CM

## 2019-05-14 DIAGNOSIS — S161XXA Strain of muscle, fascia and tendon at neck level, initial encounter: Secondary | ICD-10-CM

## 2019-05-14 DIAGNOSIS — M7701 Medial epicondylitis, right elbow: Secondary | ICD-10-CM

## 2019-05-14 DIAGNOSIS — S161XXD Strain of muscle, fascia and tendon at neck level, subsequent encounter: Secondary | ICD-10-CM

## 2019-05-14 DIAGNOSIS — S46091D Other injury of muscle(s) and tendon(s) of the rotator cuff of right shoulder, subsequent encounter: Secondary | ICD-10-CM

## 2019-05-14 DIAGNOSIS — S46091S Other injury of muscle(s) and tendon(s) of the rotator cuff of right shoulder, sequela: Secondary | ICD-10-CM

## 2019-05-30 ENCOUNTER — Ambulatory Visit: Payer: Medicaid Other | Attending: Internal Medicine

## 2019-05-30 DIAGNOSIS — Z20822 Contact with and (suspected) exposure to covid-19: Secondary | ICD-10-CM

## 2019-05-31 LAB — NOVEL CORONAVIRUS, NAA: SARS-CoV-2, NAA: NOT DETECTED
